# Patient Record
Sex: Male | Born: 1945
Health system: Southern US, Community
[De-identification: ages and names within clinical notes are randomized; demographics above are authoritative.]

## PROBLEM LIST (undated history)

## (undated) DIAGNOSIS — N401 Enlarged prostate with lower urinary tract symptoms: Secondary | ICD-10-CM

## (undated) DIAGNOSIS — I10 Essential (primary) hypertension: Secondary | ICD-10-CM

## (undated) DIAGNOSIS — M1990 Unspecified osteoarthritis, unspecified site: Secondary | ICD-10-CM

## (undated) DIAGNOSIS — F32A Depression, unspecified: Secondary | ICD-10-CM

## (undated) DIAGNOSIS — N138 Other obstructive and reflux uropathy: Secondary | ICD-10-CM

## (undated) DIAGNOSIS — Z5189 Encounter for other specified aftercare: Secondary | ICD-10-CM

## (undated) DIAGNOSIS — F4310 Post-traumatic stress disorder, unspecified: Secondary | ICD-10-CM

## (undated) DIAGNOSIS — F329 Major depressive disorder, single episode, unspecified: Secondary | ICD-10-CM

## (undated) DIAGNOSIS — N529 Male erectile dysfunction, unspecified: Secondary | ICD-10-CM

## (undated) DIAGNOSIS — G709 Myoneural disorder, unspecified: Secondary | ICD-10-CM

## (undated) DIAGNOSIS — F419 Anxiety disorder, unspecified: Secondary | ICD-10-CM

## (undated) DIAGNOSIS — E119 Type 2 diabetes mellitus without complications: Secondary | ICD-10-CM

## (undated) HISTORY — DX: Encounter for other specified aftercare: Z51.89

## (undated) HISTORY — DX: Myoneural disorder, unspecified: G70.9

## (undated) HISTORY — DX: Male erectile dysfunction, unspecified: N52.9

## (undated) HISTORY — DX: Benign prostatic hyperplasia with lower urinary tract symptoms: N13.8

## (undated) HISTORY — DX: Anxiety disorder, unspecified: F41.9

## (undated) HISTORY — DX: Unspecified osteoarthritis, unspecified site: M19.90

## (undated) HISTORY — DX: Essential (primary) hypertension: I10

## (undated) HISTORY — PX: KNEE SURGERY: SHX244

## (undated) HISTORY — DX: Type 2 diabetes mellitus without complications: E11.9

## (undated) HISTORY — DX: Post-traumatic stress disorder, unspecified: F43.10

## (undated) HISTORY — PX: LUMBAR FUSION: SHX111

## (undated) HISTORY — DX: Depression, unspecified: F32.A

## (undated) HISTORY — DX: Benign prostatic hyperplasia with lower urinary tract symptoms: N40.1

## (undated) HISTORY — DX: Major depressive disorder, single episode, unspecified: F32.9

---

## 1998-03-12 ENCOUNTER — Emergency Department (HOSPITAL_COMMUNITY): Admission: EM | Admit: 1998-03-12 | Discharge: 1998-03-12 | Payer: Self-pay | Admitting: *Deleted

## 1998-03-12 ENCOUNTER — Encounter: Payer: Self-pay | Admitting: Emergency Medicine

## 2003-06-22 ENCOUNTER — Encounter: Admission: RE | Admit: 2003-06-22 | Discharge: 2003-06-22 | Payer: Self-pay | Admitting: Family Medicine

## 2006-04-07 ENCOUNTER — Emergency Department (HOSPITAL_COMMUNITY): Admission: EM | Admit: 2006-04-07 | Discharge: 2006-04-07 | Payer: Self-pay | Admitting: Family Medicine

## 2007-10-02 ENCOUNTER — Ambulatory Visit: Payer: Self-pay | Admitting: Gastroenterology

## 2007-10-10 ENCOUNTER — Ambulatory Visit: Payer: Self-pay | Admitting: Gastroenterology

## 2007-10-10 HISTORY — PX: COLONOSCOPY: SHX174

## 2007-10-10 LAB — HM COLONOSCOPY

## 2008-08-25 ENCOUNTER — Ambulatory Visit: Payer: Self-pay | Admitting: Family Medicine

## 2008-08-25 DIAGNOSIS — I10 Essential (primary) hypertension: Secondary | ICD-10-CM

## 2008-08-25 DIAGNOSIS — F4310 Post-traumatic stress disorder, unspecified: Secondary | ICD-10-CM

## 2008-08-25 DIAGNOSIS — M1990 Unspecified osteoarthritis, unspecified site: Secondary | ICD-10-CM | POA: Insufficient documentation

## 2008-10-13 ENCOUNTER — Ambulatory Visit: Payer: Self-pay | Admitting: Family Medicine

## 2008-10-13 LAB — CONVERTED CEMR LAB
Bilirubin Urine: NEGATIVE
Blood in Urine, dipstick: NEGATIVE
Glucose, Urine, Semiquant: NEGATIVE
Ketones, urine, test strip: NEGATIVE
Nitrite: NEGATIVE
Protein, U semiquant: NEGATIVE
Specific Gravity, Urine: 1.015
Urobilinogen, UA: 0.2
WBC Urine, dipstick: NEGATIVE
pH: 6.5

## 2008-10-14 ENCOUNTER — Encounter: Payer: Self-pay | Admitting: Family Medicine

## 2008-10-14 LAB — CONVERTED CEMR LAB
ALT: 23 units/L (ref 0–53)
AST: 30 units/L (ref 0–37)
BUN: 14 mg/dL (ref 6–23)
Basophils Absolute: 0 10*3/uL (ref 0.0–0.1)
Bilirubin, Direct: 0 mg/dL (ref 0.0–0.3)
Cholesterol: 185 mg/dL (ref 0–200)
Creatinine, Ser: 1.2 mg/dL (ref 0.4–1.5)
Eosinophils Relative: 0.7 % (ref 0.0–5.0)
GFR calc non Af Amer: 78.88 mL/min (ref >60)
Glucose, Bld: 97 mg/dL (ref 70–99)
HDL: 54 mg/dL (ref >39.00)
LDL Cholesterol: 111 mg/dL — ABNORMAL HIGH (ref 0–99)
Monocytes Absolute: 0.3 10*3/uL (ref 0.1–1.0)
Monocytes Relative: 5.8 % (ref 3.0–12.0)
Neutrophils Relative %: 65.8 % (ref 43.0–77.0)
PSA: 1.17 ng/mL (ref 0.10–4.00)
Platelets: 156 10*3/uL (ref 150.0–400.0)
RDW: 13.5 % (ref 11.5–14.6)
Total Bilirubin: 1 mg/dL (ref 0.3–1.2)
Triglycerides: 100 mg/dL (ref 0.0–149.0)
VLDL: 20 mg/dL (ref 0.0–40.0)
WBC: 5.6 10*3/uL (ref 4.5–10.5)

## 2008-10-20 ENCOUNTER — Ambulatory Visit: Payer: Self-pay | Admitting: Family Medicine

## 2009-01-03 ENCOUNTER — Ambulatory Visit: Payer: Self-pay | Admitting: Family Medicine

## 2009-01-03 DIAGNOSIS — N138 Other obstructive and reflux uropathy: Secondary | ICD-10-CM

## 2009-01-03 DIAGNOSIS — M545 Low back pain: Secondary | ICD-10-CM

## 2009-01-03 DIAGNOSIS — N401 Enlarged prostate with lower urinary tract symptoms: Secondary | ICD-10-CM

## 2009-01-03 LAB — CONVERTED CEMR LAB
Bilirubin Urine: NEGATIVE
Blood in Urine, dipstick: NEGATIVE
Ketones, urine, test strip: NEGATIVE
Nitrite: NEGATIVE
Specific Gravity, Urine: 1.025

## 2009-01-31 ENCOUNTER — Ambulatory Visit: Payer: Self-pay | Admitting: Licensed Clinical Social Worker

## 2009-02-14 ENCOUNTER — Ambulatory Visit: Payer: Self-pay | Admitting: Licensed Clinical Social Worker

## 2009-02-23 ENCOUNTER — Ambulatory Visit: Payer: Self-pay | Admitting: Family Medicine

## 2009-02-23 DIAGNOSIS — N529 Male erectile dysfunction, unspecified: Secondary | ICD-10-CM | POA: Insufficient documentation

## 2009-02-24 ENCOUNTER — Encounter: Payer: Self-pay | Admitting: Family Medicine

## 2009-02-25 LAB — CONVERTED CEMR LAB: Creatinine, Ser: 1.23 mg/dL (ref 0.40–1.50)

## 2009-02-26 ENCOUNTER — Encounter: Admission: RE | Admit: 2009-02-26 | Discharge: 2009-02-26 | Payer: Self-pay | Admitting: Family Medicine

## 2009-02-28 ENCOUNTER — Ambulatory Visit: Payer: Self-pay | Admitting: Licensed Clinical Social Worker

## 2009-02-28 ENCOUNTER — Telehealth: Payer: Self-pay | Admitting: Family Medicine

## 2009-03-09 ENCOUNTER — Ambulatory Visit: Payer: Self-pay | Admitting: Licensed Clinical Social Worker

## 2009-03-21 ENCOUNTER — Ambulatory Visit: Payer: Self-pay | Admitting: Licensed Clinical Social Worker

## 2009-03-29 ENCOUNTER — Encounter (INDEPENDENT_AMBULATORY_CARE_PROVIDER_SITE_OTHER): Payer: Self-pay | Admitting: *Deleted

## 2009-03-30 ENCOUNTER — Ambulatory Visit: Payer: Self-pay | Admitting: Licensed Clinical Social Worker

## 2009-04-01 ENCOUNTER — Encounter (INDEPENDENT_AMBULATORY_CARE_PROVIDER_SITE_OTHER): Payer: Self-pay | Admitting: *Deleted

## 2009-04-06 ENCOUNTER — Ambulatory Visit: Payer: Self-pay | Admitting: Licensed Clinical Social Worker

## 2009-04-11 ENCOUNTER — Ambulatory Visit: Payer: Self-pay | Admitting: Family Medicine

## 2009-04-13 ENCOUNTER — Ambulatory Visit: Payer: Self-pay | Admitting: Licensed Clinical Social Worker

## 2009-04-20 ENCOUNTER — Encounter: Admission: RE | Admit: 2009-04-20 | Discharge: 2009-05-11 | Payer: Self-pay | Admitting: Diagnostic Neuroimaging

## 2009-04-27 ENCOUNTER — Ambulatory Visit: Payer: Self-pay | Admitting: Licensed Clinical Social Worker

## 2009-05-11 ENCOUNTER — Ambulatory Visit: Payer: Self-pay | Admitting: Licensed Clinical Social Worker

## 2009-05-16 ENCOUNTER — Encounter: Admission: RE | Admit: 2009-05-16 | Discharge: 2009-06-18 | Payer: Self-pay | Admitting: Diagnostic Neuroimaging

## 2009-05-18 ENCOUNTER — Ambulatory Visit: Payer: Self-pay | Admitting: Licensed Clinical Social Worker

## 2009-05-26 ENCOUNTER — Ambulatory Visit: Payer: Self-pay | Admitting: Licensed Clinical Social Worker

## 2009-06-01 ENCOUNTER — Ambulatory Visit: Payer: Self-pay | Admitting: Licensed Clinical Social Worker

## 2009-06-08 ENCOUNTER — Ambulatory Visit: Payer: Self-pay | Admitting: Licensed Clinical Social Worker

## 2009-06-09 ENCOUNTER — Encounter: Payer: Self-pay | Admitting: Family Medicine

## 2009-06-22 ENCOUNTER — Ambulatory Visit: Payer: Self-pay | Admitting: Licensed Clinical Social Worker

## 2009-07-20 ENCOUNTER — Ambulatory Visit: Payer: Self-pay | Admitting: Licensed Clinical Social Worker

## 2009-07-27 ENCOUNTER — Ambulatory Visit: Payer: Self-pay | Admitting: Licensed Clinical Social Worker

## 2009-08-15 ENCOUNTER — Ambulatory Visit: Payer: Self-pay | Admitting: Licensed Clinical Social Worker

## 2009-08-22 ENCOUNTER — Ambulatory Visit: Payer: Self-pay | Admitting: Licensed Clinical Social Worker

## 2009-08-30 ENCOUNTER — Ambulatory Visit: Payer: Self-pay | Admitting: Licensed Clinical Social Worker

## 2009-09-07 ENCOUNTER — Ambulatory Visit: Payer: Self-pay | Admitting: Licensed Clinical Social Worker

## 2009-09-14 ENCOUNTER — Ambulatory Visit: Payer: Self-pay | Admitting: Licensed Clinical Social Worker

## 2009-09-21 ENCOUNTER — Ambulatory Visit: Payer: Self-pay | Admitting: Licensed Clinical Social Worker

## 2009-09-30 ENCOUNTER — Telehealth: Payer: Self-pay | Admitting: Family Medicine

## 2009-10-03 ENCOUNTER — Ambulatory Visit: Payer: Self-pay | Admitting: Family Medicine

## 2009-10-03 DIAGNOSIS — M25569 Pain in unspecified knee: Secondary | ICD-10-CM | POA: Insufficient documentation

## 2009-10-05 ENCOUNTER — Ambulatory Visit: Payer: Self-pay | Admitting: Licensed Clinical Social Worker

## 2009-10-19 ENCOUNTER — Ambulatory Visit: Payer: Self-pay | Admitting: Licensed Clinical Social Worker

## 2009-10-26 ENCOUNTER — Ambulatory Visit: Payer: Self-pay | Admitting: Licensed Clinical Social Worker

## 2009-11-02 ENCOUNTER — Ambulatory Visit: Payer: Self-pay | Admitting: Licensed Clinical Social Worker

## 2009-11-02 ENCOUNTER — Telehealth: Payer: Self-pay | Admitting: Family Medicine

## 2009-11-09 ENCOUNTER — Ambulatory Visit: Payer: Self-pay | Admitting: Licensed Clinical Social Worker

## 2009-11-16 ENCOUNTER — Ambulatory Visit: Payer: Self-pay | Admitting: Licensed Clinical Social Worker

## 2009-11-23 ENCOUNTER — Ambulatory Visit: Payer: Self-pay | Admitting: Licensed Clinical Social Worker

## 2009-11-30 ENCOUNTER — Ambulatory Visit: Payer: Self-pay | Admitting: Family Medicine

## 2009-12-01 ENCOUNTER — Ambulatory Visit: Payer: Self-pay | Admitting: Licensed Clinical Social Worker

## 2009-12-07 ENCOUNTER — Ambulatory Visit: Payer: Self-pay | Admitting: Licensed Clinical Social Worker

## 2009-12-21 ENCOUNTER — Telehealth: Payer: Self-pay | Admitting: Family Medicine

## 2009-12-21 ENCOUNTER — Ambulatory Visit: Payer: Self-pay | Admitting: Licensed Clinical Social Worker

## 2010-01-04 ENCOUNTER — Ambulatory Visit: Payer: Self-pay | Admitting: Licensed Clinical Social Worker

## 2010-01-11 ENCOUNTER — Ambulatory Visit: Payer: Self-pay | Admitting: Licensed Clinical Social Worker

## 2010-01-19 ENCOUNTER — Ambulatory Visit: Payer: Self-pay | Admitting: Licensed Clinical Social Worker

## 2010-01-23 ENCOUNTER — Ambulatory Visit: Payer: Self-pay | Admitting: Family Medicine

## 2010-01-23 DIAGNOSIS — R42 Dizziness and giddiness: Secondary | ICD-10-CM

## 2010-01-25 ENCOUNTER — Ambulatory Visit: Payer: Self-pay | Admitting: Licensed Clinical Social Worker

## 2010-02-08 ENCOUNTER — Ambulatory Visit: Payer: Self-pay | Admitting: Licensed Clinical Social Worker

## 2010-02-09 ENCOUNTER — Telehealth: Payer: Self-pay | Admitting: Family Medicine

## 2010-02-15 ENCOUNTER — Ambulatory Visit: Payer: Self-pay | Admitting: Licensed Clinical Social Worker

## 2010-03-01 ENCOUNTER — Ambulatory Visit: Payer: Self-pay | Admitting: Licensed Clinical Social Worker

## 2010-03-15 ENCOUNTER — Ambulatory Visit: Payer: Self-pay | Admitting: Licensed Clinical Social Worker

## 2010-03-29 ENCOUNTER — Ambulatory Visit: Payer: Self-pay | Admitting: Licensed Clinical Social Worker

## 2010-04-05 ENCOUNTER — Ambulatory Visit: Payer: Self-pay | Admitting: Licensed Clinical Social Worker

## 2010-04-19 ENCOUNTER — Ambulatory Visit: Payer: Self-pay | Admitting: Licensed Clinical Social Worker

## 2010-04-26 ENCOUNTER — Ambulatory Visit: Payer: Self-pay | Admitting: Licensed Clinical Social Worker

## 2010-04-29 ENCOUNTER — Emergency Department (HOSPITAL_COMMUNITY)
Admission: EM | Admit: 2010-04-29 | Discharge: 2010-04-29 | Payer: Self-pay | Source: Home / Self Care | Admitting: Emergency Medicine

## 2010-05-01 ENCOUNTER — Ambulatory Visit: Payer: Self-pay | Admitting: Family Medicine

## 2010-05-02 ENCOUNTER — Encounter: Payer: Self-pay | Admitting: Family Medicine

## 2010-05-03 ENCOUNTER — Ambulatory Visit
Admission: RE | Admit: 2010-05-03 | Discharge: 2010-05-03 | Payer: Self-pay | Source: Home / Self Care | Attending: Licensed Clinical Social Worker | Admitting: Licensed Clinical Social Worker

## 2010-05-03 ENCOUNTER — Encounter: Payer: Self-pay | Admitting: Family Medicine

## 2010-05-17 ENCOUNTER — Ambulatory Visit
Admission: RE | Admit: 2010-05-17 | Discharge: 2010-05-17 | Payer: Self-pay | Source: Home / Self Care | Attending: Licensed Clinical Social Worker | Admitting: Licensed Clinical Social Worker

## 2010-05-24 ENCOUNTER — Ambulatory Visit
Admission: RE | Admit: 2010-05-24 | Discharge: 2010-05-24 | Payer: Self-pay | Source: Home / Self Care | Attending: Licensed Clinical Social Worker | Admitting: Licensed Clinical Social Worker

## 2010-06-13 NOTE — Progress Notes (Signed)
Summary: ? new med   Phone Note Call from Patient   Caller: pt walk-in Summary of Call: stated the "new med that was given to him by Dr Clent Ridges he cannot tell any difference. pls call  Initial call taken by: Pura Spice, RN,  December 21, 2009 1:02 PM  Follow-up for Phone Call        stay on Wellbutrin, but add Zoloft 50 mg a day to it. call in Zoloft 50 mg #30 with 2 rf.  Follow-up by: Nelwyn Salisbury MD,  December 21, 2009 1:18 PM  Additional Follow-up for Phone Call Additional follow up Details #1::        lmtcb Additional Follow-up by: Raechel Ache, RN,  December 21, 2009 1:20 PM    New/Updated Medications: ZOLOFT 50 MG TABS (SERTRALINE HCL) 1 once daily Prescriptions: ZOLOFT 50 MG TABS (SERTRALINE HCL) 1 once daily  #30 x 2   Entered by:   Raechel Ache, RN   Authorized by:   Nelwyn Salisbury MD   Signed by:   Raechel Ache, RN on 12/21/2009   Method used:   [facility]  [address] [phone]* (retail)       [address]       [place], [place]  [address]       Ph: [phone]       Fax: [phone]   RxID:   [id number]

## 2010-06-13 NOTE — Letter (Signed)
Summary: [facility]  [facility]   Imported By: Maryln Gottron 06/30/2009 14:59:08  _____________________________________________________________________  External Attachment:    Type:   Image     Comment:   External Document

## 2010-06-13 NOTE — Assessment & Plan Note (Signed)
Summary: fu on med/njr   Vital Signs:  Patient profile:   65 year old male Weight:      187 pounds BMI:     31.23 BP sitting:   116 / 84  (left arm) Cuff size:   regular  Vitals Entered By: Raechel Ache, RN (November 30, 2009 2:13 PM) CC: F/u meds.   History of Present Illness: Here to follow up on depression. he has felt some mild improvement on Wellbutrin, saying he sleeps better and his moods are slightly improved. He still has a way to go, however, and would like to increase the dose. He continues to see Judithe Modest for therapy as well.   Allergies: No Known Drug Allergies  Past History:  Past Medical History: Reviewed history from 04/11/2009 and no changes required. Chickenpox Depression Hypertension Osteoarthritis PTSD, sees a counselor through the [place] system Low back pain, sees Dr. Joycelyn Schmid normal NCS and EMG of legs 04-05-09  Review of Systems  The patient denies anorexia, fever, weight loss, weight gain, vision loss, decreased hearing, hoarseness, chest pain, syncope, dyspnea on exertion, peripheral edema, prolonged cough, headaches, hemoptysis, abdominal pain, melena, hematochezia, severe indigestion/heartburn, hematuria, incontinence, genital sores, muscle weakness, suspicious skin lesions, transient blindness, difficulty walking, unusual weight change, abnormal bleeding, enlarged lymph nodes, angioedema, breast masses, and testicular masses.    Physical Exam  General:  Well-developed,well-nourished,in no acute distress; alert,appropriate and cooperative throughout examination Psych:  Oriented X3, memory intact for recent and remote, normally interactive, good eye contact, and depressed affect.     Impression & Recommendations:  Problem # 1:  DEPRESSION (ICD-311)  The following medications were removed from the medication list:    Wellbutrin Xl 150 Mg Xr24h-tab (Bupropion hcl) .Marland Kitchen... 1 once daily His updated medication list for this problem includes:  Wellbutrin Xl 300 Mg Xr24h-tab (Bupropion hcl) ..... Once daily  Problem # 2:  POSTTRAUMATIC STRESS DISORDER (ICD-309.81)  Complete Medication List: 1)  Hydrochlorothiazide 25 Mg Tabs (Hydrochlorothiazide) .... Once daily 2)  Norvasc 5 Mg Tabs (Amlodipine besylate) .... Once daily 3)  Flexeril 10 Mg Tabs (Cyclobenzaprine hcl) .... Three times a day as needed spasm 4)  Vicodin Hp 10-660 Mg Tabs (Hydrocodone-acetaminophen) .Marland Kitchen.. 1 q 6 hours as needed pain 5)  Flomax 0.4 Mg Caps (Tamsulosin hcl) .... Once daily 6)  Cialis 20 Mg Tabs (Tadalafil) .... As needed 7)  Gabapentin 300 Mg Caps (Gabapentin) .... Two times a day 8)  Wellbutrin Xl 300 Mg Xr24h-tab (Bupropion hcl) .... Once daily  Patient Instructions: 1)  After discussing this for 30 minutes, we decided to increase the Wellbutrin to 300 mg a day.  2)  Please schedule a follow-up appointment in 2 weeks.  Prescriptions: WELLBUTRIN XL 300 MG XR24H-TAB (BUPROPION HCL) once daily  #30 x 11   Entered and Authorized by:   Nelwyn Salisbury MD   Signed by:   Nelwyn Salisbury MD on 11/30/2009   Method used:   [facility]        [facility]  [address] [phone]* (retail)       [address]       [place], [place]  [address]       Ph: [phone]       Fax: [phone]   RxID:   [id number]

## 2010-06-13 NOTE — Progress Notes (Signed)
Summary: NEW MED NOT WORKING  Phone Note Call from Patient Call back at Home Phone [phone]   Caller: Patient Call For: Nelwyn Salisbury MD Summary of Call: PT STATED NEW MED NOT WORKING(prozac) Initial call taken by: Heron Sabins,  February 09, 2010 2:21 PM  Follow-up for Phone Call        increase Prozac to 40 mg two times a day for 2 weeks, then give me a report Follow-up by: Nelwyn Salisbury MD,  February 10, 2010 10:23 AM  Additional Follow-up for Phone Call Additional follow up Details #1::        Notified pt. Additional Follow-up by: Lynann Beaver CMA,  February 10, 2010 11:34 AM    New/Updated Medications: PROZAC 40 MG CAPS (FLUOXETINE HCL) one by mouth bid

## 2010-06-13 NOTE — Progress Notes (Signed)
Summary: refill Vicodin  Phone Note Refill Request Message from:  Fax from Pharmacy on Sep 30, 2009 11:17 AM  Refills Requested: Medication #1:  VICODIN HP 10-660 MG TABS 1 q 6 hours as needed pain   Dosage confirmed as above?Dosage Confirmed   Supply Requested: 1 month   Last Refilled: 08/27/2009  Method Requested: Fax to Local Pharmacy Initial call taken by: Raechel Ache, RN,  Sep 30, 2009 11:17 AM [facility]er: [facility]  [address] [phone]*  Follow-up for Phone Call        I referred him to Dr. Murray Hodgkins to treat his back pain, and he did see him in January. He needs to get all hus pain meds from Dr. Murray Hodgkins now Follow-up by: Nelwyn Salisbury MD,  Sep 30, 2009 2:51 PM  Additional Follow-up for Phone Call Additional follow up Details #1::        Rx denial called/faxed to pharmacy Additional Follow-up by: Raechel Ache, RN,  Sep 30, 2009 3:09 PM

## 2010-06-13 NOTE — Assessment & Plan Note (Signed)
Summary: fu on med/njr   Vital Signs:  Patient profile:   65 year old male Weight:      192 pounds O2 Sat:      97 % Temp:     98.8 degrees F Pulse rate:   98 / minute BP sitting:   140 / 80  (left arm) Cuff size:   large  Vitals Entered By: Pura Spice, RN (January 23, 2010 10:04 AM) CC: discuss meds refills  Is Patient Diabetic? No   History of Present Illness: Here to follow up on depression. he has been taking a combination of Wellbutrin and Zoloft, abut he does not feel amy better. In fact, he has had a few dizzy spells which occur when he stands up quickly and which go away after a few seconds. He says these started after he began taking Zoloft.   Allergies (verified): No Known Drug Allergies  Past History:  Past Medical History: Reviewed history from 04/11/2009 and no changes required. Chickenpox Depression Hypertension Osteoarthritis PTSD, sees a counselor through the [place] system Low back pain, sees Dr. Joycelyn Schmid normal NCS and EMG of legs 04-05-09  Review of Systems  The patient denies anorexia, fever, weight loss, weight gain, vision loss, decreased hearing, hoarseness, chest pain, syncope, dyspnea on exertion, peripheral edema, prolonged cough, headaches, hemoptysis, abdominal pain, melena, hematochezia, severe indigestion/heartburn, hematuria, incontinence, genital sores, muscle weakness, suspicious skin lesions, transient blindness, difficulty walking, depression, unusual weight change, abnormal bleeding, enlarged lymph nodes, angioedema, breast masses, and testicular masses.    Physical Exam  General:  Well-developed,well-nourished,in no acute distress; alert,appropriate and cooperative throughout examination Neck:  No deformities, masses, or tenderness noted. Lungs:  Normal respiratory effort, chest expands symmetrically. Lungs are clear to auscultation, no crackles or wheezes. Heart:  Normal rate and regular rhythm. S1 and S2 normal without  gallop, murmur, click, rub or other extra sounds. Psych:  Oriented X3, memory intact for recent and remote, normally interactive, good eye contact, and subdued.     Impression & Recommendations:  Problem # 1:  DEPRESSION (ICD-311)  The following medications were removed from the medication list:    Zoloft 50 Mg Tabs (Sertraline hcl) .Marland Kitchen... 1 once daily His updated medication list for this problem includes:    Wellbutrin Xl 300 Mg Xr24h-tab (Bupropion hcl) ..... Once daily    Prozac 40 Mg Caps (Fluoxetine hcl) ..... Once daily  Problem # 2:  DIZZINESS (ICD-780.4)  Complete Medication List: 1)  Hydrochlorothiazide 25 Mg Tabs (Hydrochlorothiazide) .... Once daily 2)  Norvasc 5 Mg Tabs (Amlodipine besylate) .... Once daily 3)  Flexeril 10 Mg Tabs (Cyclobenzaprine hcl) .... Three times a day as needed spasm 4)  Vicodin Hp 10-660 Mg Tabs (Hydrocodone-acetaminophen) .Marland Kitchen.. 1 q 6 hours as needed pain 5)  Flomax 0.4 Mg Caps (Tamsulosin hcl) .... Once daily 6)  Cialis 20 Mg Tabs (Tadalafil) .... As needed 7)  Gabapentin 300 Mg Caps (Gabapentin) .... Two times a day 8)  Wellbutrin Xl 300 Mg Xr24h-tab (Bupropion hcl) .... Once daily 9)  Prozac 40 Mg Caps (Fluoxetine hcl) .... Once daily  Patient Instructions: 1)  Stop Zoloft and try Prozac.  2)  Please schedule a follow-up appointment in 2 weeks.  Prescriptions: VICODIN HP 10-660 MG TABS (HYDROCODONE-ACETAMINOPHEN) 1 q 6 hours as needed pain  #60 x 5   Entered and Authorized by:   Nelwyn Salisbury MD   Signed by:   Nelwyn Salisbury MD on 01/23/2010   Method used:  Print then Give to Patient   RxID:   [id number] FLOMAX 0.4 MG CAPS (TAMSULOSIN HCL) once daily  #30 x 11   Entered and Authorized by:   Nelwyn Salisbury MD   Signed by:   Nelwyn Salisbury MD on 01/23/2010   Method used:   [facility]ron[facility] to        [facility]  [address] [phone]* (retail)       [address]       [place], [place]  [address]       Ph: [phone]        Fax: [phone]   RxID:   [id number] PROZAC 40 MG CAPS (FLUOXETINE HCL) once daily  #30 x 5   Entered and Authorized by:   Nelwyn Salisbury MD   Signed by:   Nelwyn Salisbury MD on 01/23/2010   Method used:   [facility]ronically to        [facility]  [address] [phone]* (retail)       [address]       [place], [place]  [address]       Ph: [phone]       Fax: [phone]   RxID:   [id number]

## 2010-06-13 NOTE — Assessment & Plan Note (Signed)
Summary: EVAL R KNEE PAIN / MED CK / REFILLS // RS   Vital Signs:  Patient profile:   65 year old male Weight:      183 pounds BMI:     30.56 BP sitting:   120 / 88  (left arm) Cuff size:   regular  Vitals Entered By: Raechel Ache, RN (Oct 03, 2009 1:21 PM) CC: C/o R knee pain and swelling.   History of Present Illness: Here for med refills and to look at his right knee. he has known arthritis in his knees, but over the weekend he did a lot of yardwork, and he developed worse pain in the right knee. It became swollen and painful. He is using ice and ACE wraps on it. He has not seen Dr. Murray Hodgkins in months, and he does not plan to see him again.   Allergies: No Known Drug Allergies  Past History:  Past Medical History: Reviewed history from 04/11/2009 and no changes required. Chickenpox Depression Hypertension Osteoarthritis PTSD, sees a counselor through the [place] [facility] Low back pain, sees Dr. Joycelyn Schmid normal NCS and EMG of legs 04-05-09  Past Surgical History: Reviewed history from 02/23/2009 and no changes required. lumbar fusion  1977 at [facility] colonoscopy 10-10-07 per Dr. Arlyce Dice, repeat in 10 yrs  Review of Systems  The patient denies anorexia, fever, weight loss, weight gain, vision loss, decreased hearing, hoarseness, chest pain, syncope, dyspnea on exertion, peripheral edema, prolonged cough, headaches, hemoptysis, abdominal pain, melena, hematochezia, severe indigestion/heartburn, hematuria, incontinence, genital sores, muscle weakness, suspicious skin lesions, transient blindness, difficulty walking, depression, unusual weight change, abnormal bleeding, enlarged lymph nodes, angioedema, breast masses, and testicular masses.    Physical Exam  General:  limping slightly Msk:  right knee is swollen and  tender at the medial joint space. Not warm or red. Full ROM. Lots of crepitus. Negarive McMurrays and anterior drawer.    Impression &  Recommendations:  Problem # 1:  KNEE PAIN (ICD-719.46)  His updated medication list for this problem includes:    Flexeril 10 Mg Tabs (Cyclobenzaprine hcl) .Marland Kitchen... Three times a day as needed spasm    Vicodin Hp 10-660 Mg Tabs (Hydrocodone-acetaminophen) .Marland Kitchen... 1 q 6 hours as needed pain  Problem # 2:  OSTEOARTHRITIS (ICD-715.90)  His updated medication list for this problem includes:    Vicodin Hp 10-660 Mg Tabs (Hydrocodone-acetaminophen) .Marland Kitchen... 1 q 6 hours as needed pain  Complete Medication List: 1)  Hydrochlorothiazide 25 Mg Tabs (Hydrochlorothiazide) .... Once daily 2)  Norvasc 5 Mg Tabs (Amlodipine besylate) .... Once daily 3)  Flexeril 10 Mg Tabs (Cyclobenzaprine hcl) .... Three times a day as needed spasm 4)  Vicodin Hp 10-660 Mg Tabs (Hydrocodone-acetaminophen) .Marland Kitchen.. 1 q 6 hours as needed pain 5)  Flomax 0.4 Mg Caps (Tamsulosin hcl) .... Once daily 6)  Cialis 20 Mg Tabs (Tadalafil) .... As needed 7)  Gabapentin 300 Mg Caps (Gabapentin) .... Two times a day 8)  Prednisone (pak) 10 Mg Tabs (Prednisone) .... As directed for 12 days  Patient Instructions: 1)  rest, ice, support wraps.  2)  Please schedule a follow-up appointment as needed .  Prescriptions: PREDNISONE (PAK) 10 MG TABS (PREDNISONE) as directed for 12 days  #1 x 0   Entered and Authorized by:   Nelwyn Salisbury MD   Signed by:   Nelwyn Salisbury MD on 10/03/2009   Method used:   Print then Give to Patient   RxID:  [id number] VICODIN HP 10-660 MG TABS (HYDROCODONE-ACETAMINOPHEN) 1 q 6 hours as needed pain  #60 x 2   Entered and Authorized by:   Nelwyn Salisbury MD   Signed by:   Nelwyn Salisbury MD on 10/03/2009   Method used:   Print then Give to Patient   RxID:   [id number]

## 2010-06-13 NOTE — Progress Notes (Signed)
  Phone Note Call from Patient Call back at [facility]   Summary of Call: After meeting with Judithe Modest for his depression, he has finally agreed to try a medication. He asks [place] to call something in.  Initial call taken by: Nelwyn Salisbury MD,  November 02, 2009 3:07 PM  Follow-up for Phone Call        call in Wellbutrin XL 150 mg once daily , #30 with 2 rf to [facility]. Have him see me in 3 weeks Follow-up by: Nelwyn Salisbury MD,  November 02, 2009 3:13 PM  Additional Follow-up for Phone Call Additional follow up Details #1::        Rx Called In Additional Follow-up by: Raechel Ache, RN,  November 02, 2009 3:42 PM    New/Updated Medications: WELLBUTRIN XL 150 MG XR24H-TAB (BUPROPION HCL) 1 once daily Prescriptions: WELLBUTRIN XL 150 MG XR24H-TAB (BUPROPION HCL) 1 once daily  #30 x 2   Entered by:   Raechel Ache, RN   Authorized by:   Nelwyn Salisbury MD   Signed by:   Raechel Ache, RN on 11/02/2009   Method used:   Electronically to        [facility]  [address] [phone]* (retail)       [address]       [place], [place]  [address]       Ph: [phone]       Fax: [phone]   RxID:   [id number]

## 2010-06-14 ENCOUNTER — Ambulatory Visit (INDEPENDENT_AMBULATORY_CARE_PROVIDER_SITE_OTHER): Payer: Federal, State, Local not specified - PPO | Admitting: Licensed Clinical Social Worker

## 2010-06-14 DIAGNOSIS — F4310 Post-traumatic stress disorder, unspecified: Secondary | ICD-10-CM

## 2010-06-14 DIAGNOSIS — F331 Major depressive disorder, recurrent, moderate: Secondary | ICD-10-CM

## 2010-06-14 DIAGNOSIS — F411 Generalized anxiety disorder: Secondary | ICD-10-CM

## 2010-06-15 NOTE — Letter (Signed)
Summary: [facility]   Imported By: Maryln Gottron 05/17/2010 13:13:11  _____________________________________________________________________  External Attachment:    Type:   Image     Comment:   External Document

## 2010-06-15 NOTE — Letter (Signed)
Summary: [facility]  [facility]   Imported By: Maryln Gottron 05/17/2010 13:54:21  _____________________________________________________________________  External Attachment:    Type:   Image     Comment:   External Document

## 2010-06-15 NOTE — Assessment & Plan Note (Signed)
Summary: er fup---cystitis//ccm   Vital Signs:  Patient profile:   65 year old male Weight:      195 pounds O2 Sat:      92 % Temp:     98.8 degrees F Pulse rate:   82 / minute BP sitting:   120 / 82  (left arm)  Vitals Entered By: Pura Spice, RN (May 01, 2010 11:14 AM) CC: seeing urologist  Dr Jacquelyne Balint tomorrow 12-20  went to Er on Sat on cipro  500mg  for 21 days  has catheter now   History of Present Illness: Here to follow up on a UTI and urinary retention due to prostatosis. He was in the ER on 04-29-10 and had a foley catheter inserted. He was started on Cipro, and he is scheduled to see Dr. McDiarmid tomorrow. he feels better now, no fever or pain.   Allergies (verified): No Known Drug Allergies  Past History:  Past Medical History: Reviewed history from 04/11/2009 and no changes required. Chickenpox Depression Hypertension Osteoarthritis PTSD, sees a counselor through the [place] [facility] Low back pain, sees Dr. Joycelyn Schmid normal NCS and EMG of legs 04-05-09  Past Surgical History: Reviewed history from 02/23/2009 and no changes required. lumbar fusion  1977 at [facility] colonoscopy 10-10-07 per Dr. Arlyce Dice, repeat in 10 yrs  Review of Systems  The patient denies anorexia, fever, weight loss, weight gain, vision loss, decreased hearing, hoarseness, chest pain, syncope, dyspnea on exertion, peripheral edema, prolonged cough, headaches, hemoptysis, abdominal pain, melena, hematochezia, severe indigestion/heartburn, hematuria, incontinence, genital sores, muscle weakness, suspicious skin lesions, transient blindness, difficulty walking, depression, unusual weight change, abnormal bleeding, enlarged lymph nodes, angioedema, breast masses, and testicular masses.    Physical Exam  General:  Well-developed,well-nourished,in no acute distress; alert,appropriate and cooperative throughout examination Abdomen:  Bowel sounds positive,abdomen soft and  non-tender without masses, organomegaly or hernias noted. Genitalia:  foley is in place    Impression & Recommendations:  Problem # 1:  ACUTE PROSTATITIS (ICD-601.0)  Problem # 2:  BENIGN PROSTATIC HYPERTROPHY, WITH OBSTRUCTION (ICD-600.01)  Complete Medication List: 1)  Hydrochlorothiazide 25 Mg Tabs (Hydrochlorothiazide) .... Once daily 2)  Norvasc 5 Mg Tabs (Amlodipine besylate) .... Once daily 3)  Flexeril 10 Mg Tabs (Cyclobenzaprine hcl) .... Three times a day as needed spasm 4)  Vicodin Hp 10-660 Mg Tabs (Hydrocodone-acetaminophen) .Marland Kitchen.. 1 q 6 hours as needed pain 5)  Flomax 0.4 Mg Caps (Tamsulosin hcl) .... Once daily 6)  Cialis 20 Mg Tabs (Tadalafil) .... As needed 7)  Gabapentin 300 Mg Caps (Gabapentin) .... Two times a day 8)  Wellbutrin Xl 300 Mg Xr24h-tab (Bupropion hcl) .... Once daily 9)  Prozac 40 Mg Caps (Fluoxetine hcl) .... One by mouth bid 10)  Cipro 500 Mg Tabs (Ciprofloxacin hcl)  Patient Instructions: 1)  see Urology tomorrow    Orders Added: 1)  Est. Patient Level IV [16109]

## 2010-06-21 ENCOUNTER — Ambulatory Visit (INDEPENDENT_AMBULATORY_CARE_PROVIDER_SITE_OTHER): Payer: Federal, State, Local not specified - PPO | Admitting: Licensed Clinical Social Worker

## 2010-06-21 DIAGNOSIS — F411 Generalized anxiety disorder: Secondary | ICD-10-CM

## 2010-06-21 DIAGNOSIS — F331 Major depressive disorder, recurrent, moderate: Secondary | ICD-10-CM

## 2010-06-21 DIAGNOSIS — F4310 Post-traumatic stress disorder, unspecified: Secondary | ICD-10-CM

## 2010-07-05 ENCOUNTER — Ambulatory Visit (INDEPENDENT_AMBULATORY_CARE_PROVIDER_SITE_OTHER): Payer: Federal, State, Local not specified - PPO | Admitting: Licensed Clinical Social Worker

## 2010-07-05 DIAGNOSIS — F411 Generalized anxiety disorder: Secondary | ICD-10-CM

## 2010-07-05 DIAGNOSIS — F331 Major depressive disorder, recurrent, moderate: Secondary | ICD-10-CM

## 2010-07-05 DIAGNOSIS — F4310 Post-traumatic stress disorder, unspecified: Secondary | ICD-10-CM

## 2010-07-24 LAB — URINALYSIS, ROUTINE W REFLEX MICROSCOPIC
Glucose, UA: NEGATIVE mg/dL
Ketones, ur: NEGATIVE mg/dL
Nitrite: NEGATIVE
Protein, ur: NEGATIVE mg/dL
Urobilinogen, UA: 1 mg/dL (ref 0.0–1.0)

## 2010-07-24 LAB — URINE CULTURE
Colony Count: >=100000
Culture  Setup Time: 201112171056

## 2010-07-24 LAB — URINE MICROSCOPIC-ADD ON

## 2010-07-26 ENCOUNTER — Ambulatory Visit (INDEPENDENT_AMBULATORY_CARE_PROVIDER_SITE_OTHER): Payer: Federal, State, Local not specified - PPO | Admitting: Licensed Clinical Social Worker

## 2010-07-26 DIAGNOSIS — F411 Generalized anxiety disorder: Secondary | ICD-10-CM

## 2010-07-26 DIAGNOSIS — F331 Major depressive disorder, recurrent, moderate: Secondary | ICD-10-CM

## 2010-07-26 DIAGNOSIS — F4310 Post-traumatic stress disorder, unspecified: Secondary | ICD-10-CM

## 2010-08-09 ENCOUNTER — Ambulatory Visit (INDEPENDENT_AMBULATORY_CARE_PROVIDER_SITE_OTHER): Payer: Federal, State, Local not specified - PPO | Admitting: Licensed Clinical Social Worker

## 2010-08-09 DIAGNOSIS — F4310 Post-traumatic stress disorder, unspecified: Secondary | ICD-10-CM

## 2010-08-09 DIAGNOSIS — F331 Major depressive disorder, recurrent, moderate: Secondary | ICD-10-CM

## 2010-08-09 DIAGNOSIS — F411 Generalized anxiety disorder: Secondary | ICD-10-CM

## 2010-08-15 ENCOUNTER — Encounter: Payer: Self-pay | Admitting: Family Medicine

## 2010-08-15 ENCOUNTER — Ambulatory Visit (INDEPENDENT_AMBULATORY_CARE_PROVIDER_SITE_OTHER): Payer: Federal, State, Local not specified - PPO | Admitting: Licensed Clinical Social Worker

## 2010-08-15 DIAGNOSIS — F411 Generalized anxiety disorder: Secondary | ICD-10-CM

## 2010-08-15 DIAGNOSIS — F331 Major depressive disorder, recurrent, moderate: Secondary | ICD-10-CM

## 2010-08-15 DIAGNOSIS — F4310 Post-traumatic stress disorder, unspecified: Secondary | ICD-10-CM

## 2010-08-16 ENCOUNTER — Ambulatory Visit (INDEPENDENT_AMBULATORY_CARE_PROVIDER_SITE_OTHER): Payer: Federal, State, Local not specified - PPO | Admitting: Family Medicine

## 2010-08-16 ENCOUNTER — Other Ambulatory Visit: Payer: Self-pay | Admitting: Family Medicine

## 2010-08-16 ENCOUNTER — Encounter: Payer: Self-pay | Admitting: Family Medicine

## 2010-08-16 VITALS — BP 132/86 | HR 104 | Wt 195.0 lb

## 2010-08-16 DIAGNOSIS — L989 Disorder of the skin and subcutaneous tissue, unspecified: Secondary | ICD-10-CM

## 2010-08-16 DIAGNOSIS — M549 Dorsalgia, unspecified: Secondary | ICD-10-CM

## 2010-08-16 DIAGNOSIS — I10 Essential (primary) hypertension: Secondary | ICD-10-CM

## 2010-08-16 MED ORDER — HYDROCODONE-ACETAMINOPHEN 10-660 MG PO TABS: 1.0000 | ORAL_TABLET | Freq: Four times a day (QID) | ORAL | Status: DC | PRN

## 2010-08-16 MED ORDER — HYDROCHLOROTHIAZIDE 25 MG PO TABS: 25.0000 mg | ORAL_TABLET | Freq: Every day | ORAL | Status: DC

## 2010-08-16 MED ORDER — AMLODIPINE BESYLATE 5 MG PO TABS: 5.0000 mg | ORAL_TABLET | Freq: Every day | ORAL | Status: DC

## 2010-08-16 NOTE — Progress Notes (Signed)
  Subjective:    Patient ID: [name], male    DOB: [birth year], 65 y.o.   MRN: [id number]  HPI He also has a large mole on the right temple that he wants removed.    Review of Systems     Objective:   Physical Exam  Skin:       Large benign appearing nevus on the right temple, uniform dark brown color           Assessment & Plan:  We will refer to Dermatology for this

## 2010-08-16 NOTE — Progress Notes (Signed)
  Subjective:    Patient ID: [name], male    DOB: [birth year], 65 y.o.   MRN: [id number]  HPI Here for refills. He is doing well, and his BP is stable. He has had no prostate problems for awhile. He is seeing Dr. Raquel James at [facility] for his depression, and he has been taking Abilify for about one month now. His back and knee pain is stable.    Review of Systems  Constitutional: Negative.   Respiratory: Negative.   Cardiovascular: Negative.   Genitourinary: Negative.        Objective:   Physical Exam  Constitutional: He appears well-developed and well-nourished.  Neck: No thyromegaly present.  Cardiovascular: Normal rate, regular rhythm, normal heart sounds and intact distal pulses.   Pulmonary/Chest: Effort normal and breath sounds normal.  Musculoskeletal: Normal range of motion. He exhibits no edema and no tenderness.  Lymphadenopathy:    He has no cervical adenopathy.          Assessment & Plan:  meds were refilled.

## 2010-08-16 NOTE — Progress Notes (Signed)
Addended by: Dwaine Deter on: 08/16/2010 09:45 AM   Modules accepted: Orders

## 2010-08-24 ENCOUNTER — Ambulatory Visit (INDEPENDENT_AMBULATORY_CARE_PROVIDER_SITE_OTHER): Payer: Federal, State, Local not specified - PPO | Admitting: Licensed Clinical Social Worker

## 2010-08-24 DIAGNOSIS — F331 Major depressive disorder, recurrent, moderate: Secondary | ICD-10-CM

## 2010-08-24 DIAGNOSIS — F4310 Post-traumatic stress disorder, unspecified: Secondary | ICD-10-CM

## 2010-08-24 DIAGNOSIS — F411 Generalized anxiety disorder: Secondary | ICD-10-CM

## 2010-08-30 ENCOUNTER — Ambulatory Visit (INDEPENDENT_AMBULATORY_CARE_PROVIDER_SITE_OTHER): Payer: Federal, State, Local not specified - PPO | Admitting: Licensed Clinical Social Worker

## 2010-08-30 DIAGNOSIS — F331 Major depressive disorder, recurrent, moderate: Secondary | ICD-10-CM

## 2010-08-30 DIAGNOSIS — F411 Generalized anxiety disorder: Secondary | ICD-10-CM

## 2010-08-30 DIAGNOSIS — F4310 Post-traumatic stress disorder, unspecified: Secondary | ICD-10-CM

## 2010-09-03 ENCOUNTER — Other Ambulatory Visit: Payer: Self-pay | Admitting: Family Medicine

## 2010-09-04 ENCOUNTER — Telehealth: Payer: Self-pay | Admitting: Family Medicine

## 2010-09-04 NOTE — Telephone Encounter (Signed)
Pharmacy requesting refill on Gabapentin(300mg).

## 2010-09-05 NOTE — Telephone Encounter (Signed)
Call in Gabapentin 300 mg tid, #90 with 5 rf

## 2010-09-05 NOTE — Telephone Encounter (Signed)
Rx phoned in.   

## 2010-09-06 ENCOUNTER — Ambulatory Visit (INDEPENDENT_AMBULATORY_CARE_PROVIDER_SITE_OTHER): Payer: Federal, State, Local not specified - PPO | Admitting: Licensed Clinical Social Worker

## 2010-09-06 DIAGNOSIS — F331 Major depressive disorder, recurrent, moderate: Secondary | ICD-10-CM

## 2010-09-06 DIAGNOSIS — F4310 Post-traumatic stress disorder, unspecified: Secondary | ICD-10-CM

## 2010-09-06 DIAGNOSIS — F411 Generalized anxiety disorder: Secondary | ICD-10-CM

## 2010-09-19 ENCOUNTER — Ambulatory Visit (INDEPENDENT_AMBULATORY_CARE_PROVIDER_SITE_OTHER): Payer: Federal, State, Local not specified - PPO | Admitting: Licensed Clinical Social Worker

## 2010-09-19 DIAGNOSIS — F4310 Post-traumatic stress disorder, unspecified: Secondary | ICD-10-CM

## 2010-09-19 DIAGNOSIS — F411 Generalized anxiety disorder: Secondary | ICD-10-CM

## 2010-09-19 DIAGNOSIS — F331 Major depressive disorder, recurrent, moderate: Secondary | ICD-10-CM

## 2010-09-27 ENCOUNTER — Ambulatory Visit (INDEPENDENT_AMBULATORY_CARE_PROVIDER_SITE_OTHER): Payer: Federal, State, Local not specified - PPO | Admitting: Licensed Clinical Social Worker

## 2010-09-27 DIAGNOSIS — F4310 Post-traumatic stress disorder, unspecified: Secondary | ICD-10-CM

## 2010-09-27 DIAGNOSIS — F411 Generalized anxiety disorder: Secondary | ICD-10-CM

## 2010-09-27 DIAGNOSIS — F331 Major depressive disorder, recurrent, moderate: Secondary | ICD-10-CM

## 2010-10-12 ENCOUNTER — Ambulatory Visit (INDEPENDENT_AMBULATORY_CARE_PROVIDER_SITE_OTHER): Payer: Federal, State, Local not specified - PPO | Admitting: Licensed Clinical Social Worker

## 2010-10-12 DIAGNOSIS — F331 Major depressive disorder, recurrent, moderate: Secondary | ICD-10-CM

## 2010-10-12 DIAGNOSIS — F4310 Post-traumatic stress disorder, unspecified: Secondary | ICD-10-CM

## 2010-10-12 DIAGNOSIS — F411 Generalized anxiety disorder: Secondary | ICD-10-CM

## 2010-10-25 ENCOUNTER — Ambulatory Visit (INDEPENDENT_AMBULATORY_CARE_PROVIDER_SITE_OTHER): Payer: Federal, State, Local not specified - PPO | Admitting: Licensed Clinical Social Worker

## 2010-10-25 DIAGNOSIS — F331 Major depressive disorder, recurrent, moderate: Secondary | ICD-10-CM

## 2010-10-25 DIAGNOSIS — F411 Generalized anxiety disorder: Secondary | ICD-10-CM

## 2010-10-25 DIAGNOSIS — F4310 Post-traumatic stress disorder, unspecified: Secondary | ICD-10-CM

## 2010-11-01 ENCOUNTER — Ambulatory Visit (INDEPENDENT_AMBULATORY_CARE_PROVIDER_SITE_OTHER): Payer: Federal, State, Local not specified - PPO | Admitting: Licensed Clinical Social Worker

## 2010-11-01 DIAGNOSIS — F4310 Post-traumatic stress disorder, unspecified: Secondary | ICD-10-CM

## 2010-11-01 DIAGNOSIS — F411 Generalized anxiety disorder: Secondary | ICD-10-CM

## 2010-11-01 DIAGNOSIS — F331 Major depressive disorder, recurrent, moderate: Secondary | ICD-10-CM

## 2010-11-08 ENCOUNTER — Ambulatory Visit (INDEPENDENT_AMBULATORY_CARE_PROVIDER_SITE_OTHER): Payer: Federal, State, Local not specified - PPO | Admitting: Licensed Clinical Social Worker

## 2010-11-08 DIAGNOSIS — F331 Major depressive disorder, recurrent, moderate: Secondary | ICD-10-CM

## 2010-11-08 DIAGNOSIS — F4310 Post-traumatic stress disorder, unspecified: Secondary | ICD-10-CM

## 2010-11-08 DIAGNOSIS — F411 Generalized anxiety disorder: Secondary | ICD-10-CM

## 2010-12-04 ENCOUNTER — Ambulatory Visit (INDEPENDENT_AMBULATORY_CARE_PROVIDER_SITE_OTHER): Payer: Federal, State, Local not specified - PPO | Admitting: Family Medicine

## 2010-12-04 ENCOUNTER — Encounter: Payer: Self-pay | Admitting: Family Medicine

## 2010-12-04 VITALS — BP 144/88 | HR 97 | Temp 98.5°F | Wt 198.0 lb

## 2010-12-04 DIAGNOSIS — I10 Essential (primary) hypertension: Secondary | ICD-10-CM

## 2010-12-04 DIAGNOSIS — R6882 Decreased libido: Secondary | ICD-10-CM

## 2010-12-04 LAB — BASIC METABOLIC PANEL
BUN: 17 mg/dL (ref 6–23)
CO2: 28 mEq/L (ref 19–32)
Calcium: 9.4 mg/dL (ref 8.4–10.5)
Chloride: 95 mEq/L — ABNORMAL LOW (ref 96–112)
Creatinine, Ser: 1.3 mg/dL (ref 0.4–1.5)
Glucose, Bld: 133 mg/dL — ABNORMAL HIGH (ref 70–99)

## 2010-12-04 LAB — CBC WITH DIFFERENTIAL/PLATELET
Basophils Absolute: 0 10*3/uL (ref 0.0–0.1)
Eosinophils Relative: 0.6 % (ref 0.0–5.0)
HCT: 41.9 % (ref 39.0–52.0)
Lymphs Abs: 1.9 10*3/uL (ref 0.7–4.0)
Monocytes Absolute: 0.4 10*3/uL (ref 0.1–1.0)
Monocytes Relative: 8 % (ref 3.0–12.0)
Neutrophils Relative %: 54.3 % (ref 43.0–77.0)
Platelets: 164 10*3/uL (ref 150.0–400.0)
RDW: 14.4 % (ref 11.5–14.6)
WBC: 5.1 10*3/uL (ref 4.5–10.5)

## 2010-12-04 LAB — POCT URINALYSIS DIPSTICK
Glucose, UA: NEGATIVE
Nitrite, UA: NEGATIVE
Protein, UA: NEGATIVE
Spec Grav, UA: 1.025
Urobilinogen, UA: 0.2

## 2010-12-04 LAB — HEPATIC FUNCTION PANEL
ALT: 39 U/L (ref 0–53)
Albumin: 4.2 g/dL (ref 3.5–5.2)
Total Protein: 8.6 g/dL — ABNORMAL HIGH (ref 6.0–8.3)

## 2010-12-04 LAB — TESTOSTERONE: Testosterone: 306.27 ng/dL — ABNORMAL LOW (ref 350.00–890.00)

## 2010-12-04 LAB — TSH: TSH: 0.9 u[IU]/mL (ref 0.35–5.50)

## 2010-12-04 NOTE — Progress Notes (Signed)
  Subjective:    Patient ID: [name], male    DOB: Apr 02, 1946, 65 y.o.   MRN: [id number]  HPI Here asking to have his testosterone level checked. He has had trouble with low libido for several years, but it has been worse lately. Cialis helps a little with his erections. His depression and PTSD are stable.    Review of Systems  Constitutional: Negative.   Genitourinary: Negative.   Psychiatric/Behavioral: Negative.        Objective:   Physical Exam  Constitutional: He is oriented to person, place, and time. He appears well-developed and well-nourished.  Neurological: He is alert and oriented to person, place, and time. No cranial nerve deficit. He exhibits normal muscle tone. Coordination normal.  Psychiatric: He has a normal mood and affect. His behavior is normal. Thought content normal.          Assessment & Plan:  We will draw labs today to assess for his libido loss.

## 2010-12-06 ENCOUNTER — Ambulatory Visit (INDEPENDENT_AMBULATORY_CARE_PROVIDER_SITE_OTHER): Payer: Federal, State, Local not specified - PPO | Admitting: Licensed Clinical Social Worker

## 2010-12-06 DIAGNOSIS — F4310 Post-traumatic stress disorder, unspecified: Secondary | ICD-10-CM

## 2010-12-06 DIAGNOSIS — F331 Major depressive disorder, recurrent, moderate: Secondary | ICD-10-CM

## 2010-12-06 DIAGNOSIS — F411 Generalized anxiety disorder: Secondary | ICD-10-CM

## 2010-12-11 ENCOUNTER — Telehealth: Payer: Self-pay | Admitting: *Deleted

## 2010-12-11 MED ORDER — TESTOSTERONE 20.25 MG/ACT (1.62%) TD GEL: 4.0000 | TRANSDERMAL | Status: DC

## 2010-12-11 NOTE — Telephone Encounter (Signed)
rx sent

## 2010-12-12 ENCOUNTER — Other Ambulatory Visit: Payer: Self-pay | Admitting: Family Medicine

## 2010-12-20 ENCOUNTER — Ambulatory Visit (INDEPENDENT_AMBULATORY_CARE_PROVIDER_SITE_OTHER): Payer: Federal, State, Local not specified - PPO | Admitting: Licensed Clinical Social Worker

## 2010-12-20 DIAGNOSIS — F411 Generalized anxiety disorder: Secondary | ICD-10-CM

## 2010-12-20 DIAGNOSIS — F331 Major depressive disorder, recurrent, moderate: Secondary | ICD-10-CM

## 2010-12-20 DIAGNOSIS — F4310 Post-traumatic stress disorder, unspecified: Secondary | ICD-10-CM

## 2011-01-03 ENCOUNTER — Ambulatory Visit (INDEPENDENT_AMBULATORY_CARE_PROVIDER_SITE_OTHER): Payer: Federal, State, Local not specified - PPO | Admitting: Licensed Clinical Social Worker

## 2011-01-03 DIAGNOSIS — F331 Major depressive disorder, recurrent, moderate: Secondary | ICD-10-CM

## 2011-01-03 DIAGNOSIS — F411 Generalized anxiety disorder: Secondary | ICD-10-CM

## 2011-01-03 DIAGNOSIS — F4310 Post-traumatic stress disorder, unspecified: Secondary | ICD-10-CM

## 2011-01-17 ENCOUNTER — Ambulatory Visit (INDEPENDENT_AMBULATORY_CARE_PROVIDER_SITE_OTHER): Payer: Federal, State, Local not specified - PPO | Admitting: Licensed Clinical Social Worker

## 2011-01-17 DIAGNOSIS — F331 Major depressive disorder, recurrent, moderate: Secondary | ICD-10-CM

## 2011-01-17 DIAGNOSIS — F4310 Post-traumatic stress disorder, unspecified: Secondary | ICD-10-CM

## 2011-01-17 DIAGNOSIS — F411 Generalized anxiety disorder: Secondary | ICD-10-CM

## 2011-01-31 ENCOUNTER — Ambulatory Visit (INDEPENDENT_AMBULATORY_CARE_PROVIDER_SITE_OTHER): Payer: Federal, State, Local not specified - PPO | Admitting: Licensed Clinical Social Worker

## 2011-01-31 DIAGNOSIS — F411 Generalized anxiety disorder: Secondary | ICD-10-CM

## 2011-01-31 DIAGNOSIS — F331 Major depressive disorder, recurrent, moderate: Secondary | ICD-10-CM

## 2011-01-31 DIAGNOSIS — F4310 Post-traumatic stress disorder, unspecified: Secondary | ICD-10-CM

## 2011-02-07 ENCOUNTER — Telehealth: Payer: Self-pay | Admitting: Family Medicine

## 2011-02-07 NOTE — Telephone Encounter (Signed)
Refill request for Hydrocodon/Acetaminophn 10-660 mg take 1 po q6hrs prn. Pt last here on 12/04/10 and script last filled on 01/10/11.

## 2011-02-08 ENCOUNTER — Ambulatory Visit: Payer: Federal, State, Local not specified - PPO | Admitting: Licensed Clinical Social Worker

## 2011-02-09 MED ORDER — HYDROCODONE-ACETAMINOPHEN 10-660 MG PO TABS: 1.0000 | ORAL_TABLET | Freq: Four times a day (QID) | ORAL | Status: DC | PRN

## 2011-02-09 NOTE — Telephone Encounter (Signed)
Call in #60 with 5 rf 

## 2011-02-09 NOTE — Telephone Encounter (Signed)
rx called in

## 2011-02-14 ENCOUNTER — Ambulatory Visit (INDEPENDENT_AMBULATORY_CARE_PROVIDER_SITE_OTHER): Payer: Federal, State, Local not specified - PPO | Admitting: Licensed Clinical Social Worker

## 2011-02-14 DIAGNOSIS — F4310 Post-traumatic stress disorder, unspecified: Secondary | ICD-10-CM

## 2011-02-14 DIAGNOSIS — F411 Generalized anxiety disorder: Secondary | ICD-10-CM

## 2011-02-14 DIAGNOSIS — F331 Major depressive disorder, recurrent, moderate: Secondary | ICD-10-CM

## 2011-02-23 ENCOUNTER — Other Ambulatory Visit: Payer: Self-pay | Admitting: Family Medicine

## 2011-02-27 ENCOUNTER — Telehealth: Payer: Self-pay | Admitting: Family Medicine

## 2011-02-27 NOTE — Telephone Encounter (Signed)
Refill request for Androgel 1.62 % Pump, apply 4 pumps qd and pt last here on 12/04/10.

## 2011-02-27 NOTE — Telephone Encounter (Signed)
Open in error

## 2011-03-07 ENCOUNTER — Telehealth: Payer: Self-pay | Admitting: Family Medicine

## 2011-03-07 NOTE — Telephone Encounter (Signed)
Refill request for Androgel Pump 1.62 % apply 4 pumps qd and pt last here on 12/04/10.

## 2011-03-08 MED ORDER — TESTOSTERONE 20.25 MG/ACT (1.62%) TD GEL: 4.0000 | TRANSDERMAL | Status: DC

## 2011-03-08 NOTE — Telephone Encounter (Signed)
Okay for 6 months 

## 2011-03-08 NOTE — Telephone Encounter (Signed)
Script called in

## 2011-03-14 ENCOUNTER — Ambulatory Visit (INDEPENDENT_AMBULATORY_CARE_PROVIDER_SITE_OTHER): Payer: Federal, State, Local not specified - PPO | Admitting: Licensed Clinical Social Worker

## 2011-03-14 DIAGNOSIS — F411 Generalized anxiety disorder: Secondary | ICD-10-CM

## 2011-03-14 DIAGNOSIS — F4310 Post-traumatic stress disorder, unspecified: Secondary | ICD-10-CM

## 2011-03-14 DIAGNOSIS — F331 Major depressive disorder, recurrent, moderate: Secondary | ICD-10-CM

## 2011-03-19 ENCOUNTER — Telehealth: Payer: Self-pay | Admitting: Family Medicine

## 2011-03-19 NOTE — Telephone Encounter (Signed)
Pt back went out Thursday of last week. Pt says this is an on going issue and he has been referred to a specialist before and is wanting to know were he was referred in the past so he could call and make an appt. with them. Please contact pt.

## 2011-03-20 NOTE — Telephone Encounter (Signed)
He saw Dr. Donalee Citrin (a neurosurgeon) who said this is not a surgical issue. He then sent him to see Dr. Callie Fielding (pain specialist). I suggest he go back to see Dr. Murray Hodgkins

## 2011-03-21 ENCOUNTER — Telehealth: Payer: Self-pay | Admitting: Family Medicine

## 2011-03-21 DIAGNOSIS — M545 Low back pain: Secondary | ICD-10-CM

## 2011-03-21 NOTE — Telephone Encounter (Signed)
Pt called and he wants a referral to a Ortho surgeon, is this something you can do or does he need to be seen first?

## 2011-03-21 NOTE — Telephone Encounter (Signed)
Spoke with pt

## 2011-03-22 ENCOUNTER — Ambulatory Visit (INDEPENDENT_AMBULATORY_CARE_PROVIDER_SITE_OTHER): Payer: Federal, State, Local not specified - PPO | Admitting: Licensed Clinical Social Worker

## 2011-03-22 DIAGNOSIS — F411 Generalized anxiety disorder: Secondary | ICD-10-CM

## 2011-03-22 DIAGNOSIS — F4310 Post-traumatic stress disorder, unspecified: Secondary | ICD-10-CM

## 2011-03-22 DIAGNOSIS — F331 Major depressive disorder, recurrent, moderate: Secondary | ICD-10-CM

## 2011-03-22 NOTE — Telephone Encounter (Signed)
I have done the referral.

## 2011-04-18 ENCOUNTER — Ambulatory Visit (INDEPENDENT_AMBULATORY_CARE_PROVIDER_SITE_OTHER): Payer: Federal, State, Local not specified - PPO | Admitting: Licensed Clinical Social Worker

## 2011-04-18 DIAGNOSIS — F411 Generalized anxiety disorder: Secondary | ICD-10-CM

## 2011-04-18 DIAGNOSIS — F4310 Post-traumatic stress disorder, unspecified: Secondary | ICD-10-CM

## 2011-04-18 DIAGNOSIS — F331 Major depressive disorder, recurrent, moderate: Secondary | ICD-10-CM

## 2011-04-25 ENCOUNTER — Ambulatory Visit (INDEPENDENT_AMBULATORY_CARE_PROVIDER_SITE_OTHER): Payer: Federal, State, Local not specified - PPO | Admitting: Family Medicine

## 2011-04-25 ENCOUNTER — Ambulatory Visit (INDEPENDENT_AMBULATORY_CARE_PROVIDER_SITE_OTHER): Payer: Federal, State, Local not specified - PPO | Admitting: Licensed Clinical Social Worker

## 2011-04-25 DIAGNOSIS — F4310 Post-traumatic stress disorder, unspecified: Secondary | ICD-10-CM

## 2011-04-25 DIAGNOSIS — Z23 Encounter for immunization: Secondary | ICD-10-CM

## 2011-04-25 DIAGNOSIS — F411 Generalized anxiety disorder: Secondary | ICD-10-CM

## 2011-04-25 DIAGNOSIS — F331 Major depressive disorder, recurrent, moderate: Secondary | ICD-10-CM

## 2011-05-02 ENCOUNTER — Ambulatory Visit: Payer: Federal, State, Local not specified - PPO | Admitting: Licensed Clinical Social Worker

## 2011-05-04 ENCOUNTER — Ambulatory Visit (INDEPENDENT_AMBULATORY_CARE_PROVIDER_SITE_OTHER): Payer: Federal, State, Local not specified - PPO | Admitting: Licensed Clinical Social Worker

## 2011-05-04 DIAGNOSIS — F411 Generalized anxiety disorder: Secondary | ICD-10-CM

## 2011-05-04 DIAGNOSIS — F331 Major depressive disorder, recurrent, moderate: Secondary | ICD-10-CM

## 2011-05-04 DIAGNOSIS — F4310 Post-traumatic stress disorder, unspecified: Secondary | ICD-10-CM

## 2011-05-16 ENCOUNTER — Telehealth: Payer: Self-pay | Admitting: Family Medicine

## 2011-05-16 NOTE — Telephone Encounter (Signed)
We will discuss this at the cpx

## 2011-05-16 NOTE — Telephone Encounter (Signed)
Pt coming in on 06/12/11 for cpx and would like to know if pt needs to sch a med check prior to cpx, to discuss the new testosterone med that pt was put on, or can pt wait and discuss this during his cpx?

## 2011-05-21 NOTE — Telephone Encounter (Signed)
Spoke with pt

## 2011-05-23 ENCOUNTER — Ambulatory Visit (INDEPENDENT_AMBULATORY_CARE_PROVIDER_SITE_OTHER): Payer: Federal, State, Local not specified - PPO | Admitting: Licensed Clinical Social Worker

## 2011-05-23 DIAGNOSIS — F4310 Post-traumatic stress disorder, unspecified: Secondary | ICD-10-CM

## 2011-05-23 DIAGNOSIS — F411 Generalized anxiety disorder: Secondary | ICD-10-CM

## 2011-05-23 DIAGNOSIS — F331 Major depressive disorder, recurrent, moderate: Secondary | ICD-10-CM

## 2011-05-30 ENCOUNTER — Ambulatory Visit (INDEPENDENT_AMBULATORY_CARE_PROVIDER_SITE_OTHER): Payer: Federal, State, Local not specified - PPO | Admitting: Licensed Clinical Social Worker

## 2011-05-30 DIAGNOSIS — F411 Generalized anxiety disorder: Secondary | ICD-10-CM

## 2011-05-30 DIAGNOSIS — F4310 Post-traumatic stress disorder, unspecified: Secondary | ICD-10-CM

## 2011-05-30 DIAGNOSIS — F331 Major depressive disorder, recurrent, moderate: Secondary | ICD-10-CM

## 2011-06-05 ENCOUNTER — Other Ambulatory Visit (INDEPENDENT_AMBULATORY_CARE_PROVIDER_SITE_OTHER): Payer: Federal, State, Local not specified - PPO

## 2011-06-05 DIAGNOSIS — Z0000 Encounter for general adult medical examination without abnormal findings: Secondary | ICD-10-CM

## 2011-06-05 LAB — BASIC METABOLIC PANEL
Calcium: 10 mg/dL (ref 8.4–10.5)
Chloride: 102 mEq/L (ref 96–112)
Creatinine, Ser: 1.1 mg/dL (ref 0.4–1.5)
Sodium: 138 mEq/L (ref 135–145)

## 2011-06-05 LAB — HEPATIC FUNCTION PANEL
Albumin: 4.4 g/dL (ref 3.5–5.2)
Alkaline Phosphatase: 80 U/L (ref 39–117)
Bilirubin, Direct: 0.2 mg/dL (ref 0.0–0.3)

## 2011-06-05 LAB — CBC WITH DIFFERENTIAL/PLATELET
Basophils Relative: 0.3 % (ref 0.0–3.0)
Eosinophils Relative: 0.3 % (ref 0.0–5.0)
Hemoglobin: 16.5 g/dL (ref 13.0–17.0)
Lymphocytes Relative: 17.1 % (ref 12.0–46.0)
Monocytes Relative: 4.6 % (ref 3.0–12.0)
Neutro Abs: 5.1 10*3/uL (ref 1.4–7.7)
Neutrophils Relative %: 77.7 % — ABNORMAL HIGH (ref 43.0–77.0)
RBC: 6.09 Mil/uL — ABNORMAL HIGH (ref 4.22–5.81)
WBC: 6.6 10*3/uL (ref 4.5–10.5)

## 2011-06-05 LAB — POCT URINALYSIS DIPSTICK
Bilirubin, UA: NEGATIVE
Ketones, UA: NEGATIVE
Spec Grav, UA: 1.02
pH, UA: 7

## 2011-06-05 LAB — LIPID PANEL
Cholesterol: 240 mg/dL — ABNORMAL HIGH (ref 0–200)
HDL: 56.8 mg/dL (ref >39.00)
VLDL: 46.4 mg/dL — ABNORMAL HIGH (ref 0.0–40.0)

## 2011-06-05 LAB — TSH: TSH: 0.48 u[IU]/mL (ref 0.35–5.50)

## 2011-06-06 LAB — LDL CHOLESTEROL, DIRECT: Direct LDL: 104.8 mg/dL

## 2011-06-12 ENCOUNTER — Encounter: Payer: Federal, State, Local not specified - PPO | Admitting: Family Medicine

## 2011-06-12 NOTE — Progress Notes (Signed)
Quick Note:  Spoke with pt ______ 

## 2011-06-13 ENCOUNTER — Ambulatory Visit (INDEPENDENT_AMBULATORY_CARE_PROVIDER_SITE_OTHER): Payer: Federal, State, Local not specified - PPO | Admitting: Family Medicine

## 2011-06-13 ENCOUNTER — Ambulatory Visit (INDEPENDENT_AMBULATORY_CARE_PROVIDER_SITE_OTHER): Payer: Federal, State, Local not specified - PPO | Admitting: Licensed Clinical Social Worker

## 2011-06-13 ENCOUNTER — Encounter: Payer: Self-pay | Admitting: Family Medicine

## 2011-06-13 VITALS — BP 126/84 | HR 94 | Temp 98.7°F | Ht 65.25 in | Wt 190.0 lb

## 2011-06-13 DIAGNOSIS — R413 Other amnesia: Secondary | ICD-10-CM

## 2011-06-13 DIAGNOSIS — Z0000 Encounter for general adult medical examination without abnormal findings: Secondary | ICD-10-CM

## 2011-06-13 DIAGNOSIS — F4310 Post-traumatic stress disorder, unspecified: Secondary | ICD-10-CM

## 2011-06-13 DIAGNOSIS — F411 Generalized anxiety disorder: Secondary | ICD-10-CM

## 2011-06-13 DIAGNOSIS — F331 Major depressive disorder, recurrent, moderate: Secondary | ICD-10-CM

## 2011-06-13 NOTE — Progress Notes (Signed)
Subjective:    Patient ID: [name], male    DOB: October 30, 1945, 66 y.o.   MRN: [id number]  HPI 66 yr old male for a cpx. He feels fine in general. He admits to eating a poor diet over the past year and putting on a lot of weight. This was reflected in his labs, which showed an elevated glucose, elevated lipids, and elevated liver enzymes. He has now changed his diet and has actually lost 10 lbs in the past month. He still sees Dr. Raquel James for anxiety and PTSD, and she recently gave him Prazocin to take at bedtime. He took this only twice however, because he knew it is also used for BP and he wanted to ask me about it first. He has a BP cuff at home but does not use it. As far as the PTSD goes, he has actually improved a bit in the past few months. He recalls how the anniversary date for a horrible and traumatic day he went through in the service recently passed, and he handled it better than he has been able to do in years. He still has therapy with Judithe Modest also.    Review of Systems  Constitutional: Negative.   HENT: Negative.   Eyes: Negative.   Respiratory: Negative.   Cardiovascular: Negative.   Gastrointestinal: Negative.   Genitourinary: Negative.   Musculoskeletal: Negative.   Skin: Negative.   Neurological: Negative.   Hematological: Negative.   Psychiatric/Behavioral: Negative.        Objective:   Physical Exam  Constitutional: He is oriented to person, place, and time. He appears well-developed and well-nourished. No distress.  HENT:  Head: Normocephalic and atraumatic.  Right Ear: External ear normal.  Left Ear: External ear normal.  Nose: Nose normal.  Mouth/Throat: Oropharynx is clear and moist. No oropharyngeal exudate.  Eyes: Conjunctivae and EOM are normal. Pupils are equal, round, and reactive to light. Right eye exhibits no discharge. Left eye exhibits no discharge. No scleral icterus.  Neck: Neck supple. No JVD present. No tracheal deviation present. No  thyromegaly present.  Cardiovascular: Normal rate, regular rhythm, normal heart sounds and intact distal pulses.  Exam reveals no gallop and no friction rub.   No murmur heard.      EKG normal  Pulmonary/Chest: Effort normal and breath sounds normal. No respiratory distress. He has no wheezes. He has no rales. He exhibits no tenderness.  Abdominal: Soft. Bowel sounds are normal. He exhibits no distension and no mass. There is no tenderness. There is no rebound and no guarding.  Genitourinary: Rectum normal, prostate normal and penis normal. Guaiac negative stool. No penile tenderness.  Musculoskeletal: Normal range of motion. He exhibits no edema and no tenderness.  Lymphadenopathy:    He has no cervical adenopathy.  Neurological: He is alert and oriented to person, place, and time. He has normal reflexes. No cranial nerve deficit. He exhibits normal muscle tone. Coordination normal.  Skin: Skin is warm and dry. No rash noted. He is not diaphoretic. No erythema. No pallor.  Psychiatric: He has a normal mood and affect. His behavior is normal. Judgment and thought content normal.          Assessment & Plan:  Well exam. I told him to go ahead and try the Prazocin,because I do not think it will affect his BP at all. However he should start to check his BP at home 2-3 times a week. He will watch the diet. Recheck labs in 6  months

## 2011-06-20 ENCOUNTER — Ambulatory Visit (INDEPENDENT_AMBULATORY_CARE_PROVIDER_SITE_OTHER): Payer: Federal, State, Local not specified - PPO | Admitting: Family Medicine

## 2011-06-20 ENCOUNTER — Encounter: Payer: Self-pay | Admitting: Family Medicine

## 2011-06-20 ENCOUNTER — Ambulatory Visit (INDEPENDENT_AMBULATORY_CARE_PROVIDER_SITE_OTHER): Payer: Federal, State, Local not specified - PPO | Admitting: Licensed Clinical Social Worker

## 2011-06-20 VITALS — BP 120/70 | HR 95 | Temp 98.1°F | Wt 189.0 lb

## 2011-06-20 DIAGNOSIS — F411 Generalized anxiety disorder: Secondary | ICD-10-CM

## 2011-06-20 DIAGNOSIS — K921 Melena: Secondary | ICD-10-CM

## 2011-06-20 DIAGNOSIS — F331 Major depressive disorder, recurrent, moderate: Secondary | ICD-10-CM

## 2011-06-20 DIAGNOSIS — E291 Testicular hypofunction: Secondary | ICD-10-CM

## 2011-06-20 DIAGNOSIS — F4310 Post-traumatic stress disorder, unspecified: Secondary | ICD-10-CM

## 2011-06-20 MED ORDER — HYDROCORTISONE ACETATE 25 MG RE SUPP: 25.0000 mg | Freq: Three times a day (TID) | RECTAL | Status: AC

## 2011-06-20 NOTE — Progress Notes (Signed)
  Subjective:    Patient ID: [name], male    DOB: 06-12-1945, 66 y.o.   MRN: [id number]  HPI Here for 2 reasons. First he had a BM this morning which had some bright red blood in it. The BM was a little loose but not painful. He had a normal colonoscopy in 2009. Also he has been on Androgel since last summer. At first he felt good results from this as far as his libido,etc. However lately it has not been as effective.    Review of Systems  Constitutional: Negative.   Gastrointestinal: Positive for blood in stool. Negative for abdominal pain and rectal pain.       Objective:   Physical Exam  Constitutional: He appears well-developed and well-nourished.  Abdominal: Soft. Bowel sounds are normal. He exhibits no distension and no mass. There is no tenderness. There is no rebound and no guarding.  Genitourinary: Rectum normal and prostate normal.       His stool has obvious red blood in it           Assessment & Plan:  He probably has some internal hemorrhoids. Try Anusol HC suppositories. Check a testosterone level today

## 2011-06-22 NOTE — Progress Notes (Signed)
Quick Note:  Spoke with pt ______ 

## 2011-07-03 ENCOUNTER — Ambulatory Visit (INDEPENDENT_AMBULATORY_CARE_PROVIDER_SITE_OTHER): Payer: Federal, State, Local not specified - PPO | Admitting: Licensed Clinical Social Worker

## 2011-07-03 DIAGNOSIS — F411 Generalized anxiety disorder: Secondary | ICD-10-CM

## 2011-07-03 DIAGNOSIS — F331 Major depressive disorder, recurrent, moderate: Secondary | ICD-10-CM

## 2011-07-03 DIAGNOSIS — F4310 Post-traumatic stress disorder, unspecified: Secondary | ICD-10-CM

## 2011-07-18 ENCOUNTER — Ambulatory Visit (INDEPENDENT_AMBULATORY_CARE_PROVIDER_SITE_OTHER): Payer: Federal, State, Local not specified - PPO | Admitting: Licensed Clinical Social Worker

## 2011-07-18 DIAGNOSIS — F331 Major depressive disorder, recurrent, moderate: Secondary | ICD-10-CM

## 2011-07-18 DIAGNOSIS — F411 Generalized anxiety disorder: Secondary | ICD-10-CM

## 2011-07-18 DIAGNOSIS — F4310 Post-traumatic stress disorder, unspecified: Secondary | ICD-10-CM

## 2011-07-25 ENCOUNTER — Ambulatory Visit (INDEPENDENT_AMBULATORY_CARE_PROVIDER_SITE_OTHER): Payer: Federal, State, Local not specified - PPO | Admitting: Licensed Clinical Social Worker

## 2011-07-25 DIAGNOSIS — F4310 Post-traumatic stress disorder, unspecified: Secondary | ICD-10-CM

## 2011-07-25 DIAGNOSIS — F331 Major depressive disorder, recurrent, moderate: Secondary | ICD-10-CM

## 2011-07-25 DIAGNOSIS — F411 Generalized anxiety disorder: Secondary | ICD-10-CM

## 2011-07-26 ENCOUNTER — Other Ambulatory Visit: Payer: Self-pay | Admitting: Family Medicine

## 2011-07-29 ENCOUNTER — Other Ambulatory Visit: Payer: Self-pay | Admitting: Family Medicine

## 2011-07-30 ENCOUNTER — Other Ambulatory Visit: Payer: Self-pay

## 2011-07-30 MED ORDER — HYDROCODONE-ACETAMINOPHEN 10-660 MG PO TABS: 1.0000 | ORAL_TABLET | Freq: Four times a day (QID) | ORAL | Status: DC | PRN

## 2011-07-30 NOTE — Telephone Encounter (Signed)
Rx called in to pharmacy. 

## 2011-07-30 NOTE — Telephone Encounter (Signed)
Rx request for hydrocodone-acetaminophen 10-660. Take 1 tablet q6hr prn.  Rx last filled 02/09/11 #60 x 5 rf.  Pls advise.

## 2011-07-30 NOTE — Telephone Encounter (Signed)
Call in #60 with 5 rf 

## 2011-07-30 NOTE — Telephone Encounter (Signed)
Pt last seen 06/20/11.  Rx last filled 02/23/11 #90 x 4 rf.  Pls advise.

## 2011-08-13 ENCOUNTER — Telehealth: Payer: Self-pay | Admitting: Family Medicine

## 2011-08-13 NOTE — Telephone Encounter (Signed)
Ok x 1 month  Dr Clent Ridges out of office

## 2011-08-13 NOTE — Telephone Encounter (Signed)
Refill request for Androgel 1.62% gel pump apply 4 pumps qd and pt last here on 06/20/11.

## 2011-08-14 MED ORDER — TESTOSTERONE 20.25 MG/ACT (1.62%) TD GEL: 4.0000 | TRANSDERMAL | Status: DC

## 2011-08-14 NOTE — Telephone Encounter (Signed)
Script called in

## 2011-08-16 ENCOUNTER — Ambulatory Visit (INDEPENDENT_AMBULATORY_CARE_PROVIDER_SITE_OTHER): Payer: Federal, State, Local not specified - PPO | Admitting: Licensed Clinical Social Worker

## 2011-08-16 DIAGNOSIS — F4310 Post-traumatic stress disorder, unspecified: Secondary | ICD-10-CM

## 2011-08-16 DIAGNOSIS — F411 Generalized anxiety disorder: Secondary | ICD-10-CM

## 2011-08-16 DIAGNOSIS — F331 Major depressive disorder, recurrent, moderate: Secondary | ICD-10-CM

## 2011-08-22 ENCOUNTER — Other Ambulatory Visit: Payer: Self-pay | Admitting: Family Medicine

## 2011-08-29 ENCOUNTER — Ambulatory Visit (INDEPENDENT_AMBULATORY_CARE_PROVIDER_SITE_OTHER): Payer: Federal, State, Local not specified - PPO | Admitting: Licensed Clinical Social Worker

## 2011-08-29 DIAGNOSIS — F411 Generalized anxiety disorder: Secondary | ICD-10-CM

## 2011-08-29 DIAGNOSIS — F4310 Post-traumatic stress disorder, unspecified: Secondary | ICD-10-CM

## 2011-08-29 DIAGNOSIS — F331 Major depressive disorder, recurrent, moderate: Secondary | ICD-10-CM

## 2011-09-05 ENCOUNTER — Ambulatory Visit (INDEPENDENT_AMBULATORY_CARE_PROVIDER_SITE_OTHER): Payer: Federal, State, Local not specified - PPO | Admitting: Licensed Clinical Social Worker

## 2011-09-05 DIAGNOSIS — F4310 Post-traumatic stress disorder, unspecified: Secondary | ICD-10-CM

## 2011-09-05 DIAGNOSIS — F331 Major depressive disorder, recurrent, moderate: Secondary | ICD-10-CM

## 2011-09-05 DIAGNOSIS — F411 Generalized anxiety disorder: Secondary | ICD-10-CM

## 2011-09-18 ENCOUNTER — Telehealth: Payer: Self-pay | Admitting: Family Medicine

## 2011-09-18 NOTE — Telephone Encounter (Signed)
Refill request for Androgel 1.62% gel pump apply 4 pumps qd and pt last here on 06/20/11. 

## 2011-09-20 MED ORDER — TESTOSTERONE 20.25 MG/1.25GM (1.62%) TD GEL: 4.0000 "application " | Freq: Every day | TRANSDERMAL | Status: DC

## 2011-09-20 NOTE — Telephone Encounter (Signed)
I called in script 

## 2011-09-20 NOTE — Telephone Encounter (Signed)
Okay for 6 months 

## 2011-09-26 ENCOUNTER — Ambulatory Visit (INDEPENDENT_AMBULATORY_CARE_PROVIDER_SITE_OTHER): Payer: Medicare Other | Admitting: Licensed Clinical Social Worker

## 2011-09-26 DIAGNOSIS — F411 Generalized anxiety disorder: Secondary | ICD-10-CM

## 2011-09-26 DIAGNOSIS — F4310 Post-traumatic stress disorder, unspecified: Secondary | ICD-10-CM

## 2011-09-26 DIAGNOSIS — F331 Major depressive disorder, recurrent, moderate: Secondary | ICD-10-CM

## 2011-10-02 DIAGNOSIS — F4310 Post-traumatic stress disorder, unspecified: Secondary | ICD-10-CM | POA: Diagnosis not present

## 2011-10-03 ENCOUNTER — Ambulatory Visit (INDEPENDENT_AMBULATORY_CARE_PROVIDER_SITE_OTHER): Payer: Medicare Other | Admitting: Licensed Clinical Social Worker

## 2011-10-03 DIAGNOSIS — F331 Major depressive disorder, recurrent, moderate: Secondary | ICD-10-CM

## 2011-10-03 DIAGNOSIS — F411 Generalized anxiety disorder: Secondary | ICD-10-CM

## 2011-10-03 DIAGNOSIS — F4310 Post-traumatic stress disorder, unspecified: Secondary | ICD-10-CM | POA: Diagnosis not present

## 2011-10-10 ENCOUNTER — Ambulatory Visit (INDEPENDENT_AMBULATORY_CARE_PROVIDER_SITE_OTHER): Payer: Federal, State, Local not specified - PPO | Admitting: Licensed Clinical Social Worker

## 2011-10-10 DIAGNOSIS — F4310 Post-traumatic stress disorder, unspecified: Secondary | ICD-10-CM

## 2011-10-10 DIAGNOSIS — F331 Major depressive disorder, recurrent, moderate: Secondary | ICD-10-CM | POA: Diagnosis not present

## 2011-10-10 DIAGNOSIS — F411 Generalized anxiety disorder: Secondary | ICD-10-CM | POA: Diagnosis not present

## 2011-10-24 ENCOUNTER — Ambulatory Visit (INDEPENDENT_AMBULATORY_CARE_PROVIDER_SITE_OTHER): Payer: Medicare Other | Admitting: Licensed Clinical Social Worker

## 2011-10-24 DIAGNOSIS — F411 Generalized anxiety disorder: Secondary | ICD-10-CM

## 2011-10-24 DIAGNOSIS — F4310 Post-traumatic stress disorder, unspecified: Secondary | ICD-10-CM | POA: Diagnosis not present

## 2011-10-24 DIAGNOSIS — F331 Major depressive disorder, recurrent, moderate: Secondary | ICD-10-CM | POA: Diagnosis not present

## 2011-10-30 DIAGNOSIS — F4310 Post-traumatic stress disorder, unspecified: Secondary | ICD-10-CM | POA: Diagnosis not present

## 2011-11-07 ENCOUNTER — Ambulatory Visit (INDEPENDENT_AMBULATORY_CARE_PROVIDER_SITE_OTHER): Payer: Medicare Other | Admitting: Licensed Clinical Social Worker

## 2011-11-07 DIAGNOSIS — F331 Major depressive disorder, recurrent, moderate: Secondary | ICD-10-CM

## 2011-11-07 DIAGNOSIS — F411 Generalized anxiety disorder: Secondary | ICD-10-CM | POA: Diagnosis not present

## 2011-11-07 DIAGNOSIS — F4310 Post-traumatic stress disorder, unspecified: Secondary | ICD-10-CM

## 2011-11-09 ENCOUNTER — Encounter: Payer: Self-pay | Admitting: Family Medicine

## 2011-11-09 ENCOUNTER — Ambulatory Visit (INDEPENDENT_AMBULATORY_CARE_PROVIDER_SITE_OTHER): Payer: Medicare Other | Admitting: Family Medicine

## 2011-11-09 VITALS — BP 120/72 | HR 95 | Temp 98.5°F | Wt 184.0 lb

## 2011-11-09 DIAGNOSIS — N529 Male erectile dysfunction, unspecified: Secondary | ICD-10-CM

## 2011-11-09 DIAGNOSIS — I10 Essential (primary) hypertension: Secondary | ICD-10-CM

## 2011-11-09 DIAGNOSIS — E785 Hyperlipidemia, unspecified: Secondary | ICD-10-CM

## 2011-11-09 DIAGNOSIS — F329 Major depressive disorder, single episode, unspecified: Secondary | ICD-10-CM | POA: Diagnosis not present

## 2011-11-09 DIAGNOSIS — Z0000 Encounter for general adult medical examination without abnormal findings: Secondary | ICD-10-CM

## 2011-11-09 DIAGNOSIS — Z2911 Encounter for prophylactic immunotherapy for respiratory syncytial virus (RSV): Secondary | ICD-10-CM

## 2011-11-09 LAB — HEPATIC FUNCTION PANEL
ALT: 37 U/L (ref 0–53)
Alkaline Phosphatase: 64 U/L (ref 39–117)
Bilirubin, Direct: 0 mg/dL (ref 0.0–0.3)
Total Protein: 8.1 g/dL (ref 6.0–8.3)

## 2011-11-09 MED ORDER — SILDENAFIL CITRATE 100 MG PO TABS: 50.0000 mg | ORAL_TABLET | Freq: Every day | ORAL | Status: DC | PRN

## 2011-11-09 MED ORDER — TESTOSTERONE 20.25 MG/1.25GM (1.62%) TD GEL: 4.0000 "application " | Freq: Every day | TRANSDERMAL | Status: DC

## 2011-11-09 NOTE — Progress Notes (Signed)
  Subjective:    Patient ID: [name], male    DOB: 1946-03-20, 66 y.o.   MRN: [id number]  HPI Here for follow up. His depression is doing quite well, and he still sees Judithe Modest regularly. His arthritis is well controlled with exercise. He walks and does calisthenics every morning. His BP is stable. He is not satisfied with the duration of the erections he gets with Cialis however.    Review of Systems  Constitutional: Negative.   Respiratory: Negative.   Cardiovascular: Negative.   Psychiatric/Behavioral: Negative.        Objective:   Physical Exam  Constitutional: He appears well-developed and well-nourished.  Cardiovascular: Normal rate, regular rhythm, normal heart sounds and intact distal pulses.   Psychiatric: He has a normal mood and affect. His behavior is normal. Thought content normal.          Assessment & Plan:  Get a liver panel. Try Viagra instead.

## 2011-11-09 NOTE — Addendum Note (Signed)
Addended by: Aniceto Boss A on: 11/09/2011 10:33 AM   Modules accepted: Orders

## 2011-11-12 NOTE — Progress Notes (Signed)
Quick Note:  I left voice message with normal results. ______ 

## 2011-11-19 ENCOUNTER — Other Ambulatory Visit: Payer: Self-pay | Admitting: Family Medicine

## 2011-11-21 ENCOUNTER — Ambulatory Visit (INDEPENDENT_AMBULATORY_CARE_PROVIDER_SITE_OTHER): Payer: Medicare Other | Admitting: Licensed Clinical Social Worker

## 2011-11-21 DIAGNOSIS — F331 Major depressive disorder, recurrent, moderate: Secondary | ICD-10-CM | POA: Diagnosis not present

## 2011-11-21 DIAGNOSIS — F4310 Post-traumatic stress disorder, unspecified: Secondary | ICD-10-CM

## 2011-11-21 DIAGNOSIS — F411 Generalized anxiety disorder: Secondary | ICD-10-CM | POA: Diagnosis not present

## 2011-12-05 ENCOUNTER — Ambulatory Visit (INDEPENDENT_AMBULATORY_CARE_PROVIDER_SITE_OTHER): Payer: Federal, State, Local not specified - PPO | Admitting: Licensed Clinical Social Worker

## 2011-12-05 DIAGNOSIS — F4310 Post-traumatic stress disorder, unspecified: Secondary | ICD-10-CM

## 2011-12-05 DIAGNOSIS — F411 Generalized anxiety disorder: Secondary | ICD-10-CM | POA: Diagnosis not present

## 2011-12-05 DIAGNOSIS — F331 Major depressive disorder, recurrent, moderate: Secondary | ICD-10-CM

## 2011-12-11 DIAGNOSIS — F4310 Post-traumatic stress disorder, unspecified: Secondary | ICD-10-CM | POA: Diagnosis not present

## 2011-12-12 ENCOUNTER — Ambulatory Visit (INDEPENDENT_AMBULATORY_CARE_PROVIDER_SITE_OTHER): Payer: Medicare Other | Admitting: Licensed Clinical Social Worker

## 2011-12-12 DIAGNOSIS — F411 Generalized anxiety disorder: Secondary | ICD-10-CM | POA: Diagnosis not present

## 2011-12-12 DIAGNOSIS — F4310 Post-traumatic stress disorder, unspecified: Secondary | ICD-10-CM | POA: Diagnosis not present

## 2011-12-12 DIAGNOSIS — F331 Major depressive disorder, recurrent, moderate: Secondary | ICD-10-CM

## 2011-12-18 ENCOUNTER — Ambulatory Visit (INDEPENDENT_AMBULATORY_CARE_PROVIDER_SITE_OTHER): Payer: Medicare Other | Admitting: Licensed Clinical Social Worker

## 2011-12-18 DIAGNOSIS — F331 Major depressive disorder, recurrent, moderate: Secondary | ICD-10-CM

## 2011-12-18 DIAGNOSIS — F4310 Post-traumatic stress disorder, unspecified: Secondary | ICD-10-CM | POA: Diagnosis not present

## 2011-12-18 DIAGNOSIS — F411 Generalized anxiety disorder: Secondary | ICD-10-CM

## 2011-12-26 ENCOUNTER — Ambulatory Visit (INDEPENDENT_AMBULATORY_CARE_PROVIDER_SITE_OTHER): Payer: Medicare Other | Admitting: Licensed Clinical Social Worker

## 2011-12-26 DIAGNOSIS — F411 Generalized anxiety disorder: Secondary | ICD-10-CM

## 2011-12-26 DIAGNOSIS — F4310 Post-traumatic stress disorder, unspecified: Secondary | ICD-10-CM

## 2011-12-26 DIAGNOSIS — F331 Major depressive disorder, recurrent, moderate: Secondary | ICD-10-CM | POA: Diagnosis not present

## 2012-01-09 ENCOUNTER — Ambulatory Visit (INDEPENDENT_AMBULATORY_CARE_PROVIDER_SITE_OTHER): Payer: Medicare Other | Admitting: Licensed Clinical Social Worker

## 2012-01-09 DIAGNOSIS — F331 Major depressive disorder, recurrent, moderate: Secondary | ICD-10-CM | POA: Diagnosis not present

## 2012-01-09 DIAGNOSIS — F411 Generalized anxiety disorder: Secondary | ICD-10-CM

## 2012-01-09 DIAGNOSIS — F4310 Post-traumatic stress disorder, unspecified: Secondary | ICD-10-CM

## 2012-01-14 ENCOUNTER — Other Ambulatory Visit: Payer: Self-pay | Admitting: Family Medicine

## 2012-01-15 ENCOUNTER — Telehealth: Payer: Self-pay | Admitting: *Deleted

## 2012-01-15 NOTE — Telephone Encounter (Signed)
Rx last filled 90x 4 rf on 07/29/11.  Pt last seen 11/09/11.  Pls advise.

## 2012-01-15 NOTE — Telephone Encounter (Signed)
Patient is requesting a refill of Hydrcodone-acetaminophin 10-660. Last office visit was 11/09/11. Is this okay to fill?

## 2012-01-16 MED ORDER — HYDROCODONE-ACETAMINOPHEN 10-660 MG PO TABS: 1.0000 | ORAL_TABLET | Freq: Four times a day (QID) | ORAL | Status: DC | PRN

## 2012-01-16 NOTE — Telephone Encounter (Signed)
Rx called in to pharmacy. 

## 2012-01-16 NOTE — Telephone Encounter (Signed)
Call in #60 with 5 rf 

## 2012-05-20 ENCOUNTER — Ambulatory Visit (INDEPENDENT_AMBULATORY_CARE_PROVIDER_SITE_OTHER): Payer: Medicare Other | Admitting: Family Medicine

## 2012-05-20 ENCOUNTER — Encounter: Payer: Self-pay | Admitting: Family Medicine

## 2012-05-20 VITALS — BP 138/78 | HR 96 | Temp 97.4°F | Wt 190.0 lb

## 2012-05-20 DIAGNOSIS — N410 Acute prostatitis: Secondary | ICD-10-CM

## 2012-05-20 DIAGNOSIS — I10 Essential (primary) hypertension: Secondary | ICD-10-CM | POA: Diagnosis not present

## 2012-05-20 DIAGNOSIS — N529 Male erectile dysfunction, unspecified: Secondary | ICD-10-CM

## 2012-05-20 LAB — POCT URINALYSIS DIPSTICK
Spec Grav, UA: 1.025
Urobilinogen, UA: 0.2
pH, UA: 5

## 2012-05-20 MED ORDER — CIPROFLOXACIN HCL 500 MG PO TABS: 500.0000 mg | ORAL_TABLET | Freq: Two times a day (BID) | ORAL | Status: DC

## 2012-05-20 MED ORDER — TAMSULOSIN HCL 0.4 MG PO CAPS: 0.8000 mg | ORAL_CAPSULE | Freq: Two times a day (BID) | ORAL | Status: DC

## 2012-05-20 MED ORDER — CYCLOBENZAPRINE HCL 10 MG PO TABS: 10.0000 mg | ORAL_TABLET | Freq: Three times a day (TID) | ORAL | Status: DC | PRN

## 2012-05-20 MED ORDER — LOSARTAN POTASSIUM 50 MG PO TABS: 50.0000 mg | ORAL_TABLET | Freq: Every day | ORAL | Status: DC

## 2012-05-20 NOTE — Progress Notes (Signed)
  Subjective:    Patient ID: [name], male    DOB: 07/24/1945, 67 y.o.   MRN: [id number]  HPI Here for several issues. First he has had trouble urinating for one month. He feels like he cannot totally empty his bladder and there is some urgency to go. No burning or pain. He has had trouble maintaining erections even with Viagra, and he knows that certain BP meds can make this worse. He has run out of Flomax.    Review of Systems  Constitutional: Negative.   Genitourinary: Positive for urgency, frequency and difficulty urinating. Negative for dysuria, hematuria and flank pain.       Objective:   Physical Exam  Constitutional: He appears well-developed and well-nourished.  Cardiovascular: Normal rate, regular rhythm, normal heart sounds and intact distal pulses.   Pulmonary/Chest: Effort normal and breath sounds normal.  Abdominal: Soft. Bowel sounds are normal. He exhibits no distension and no mass. There is no tenderness. There is no rebound and no guarding.          Assessment & Plan:  We will switch from Norvasc to Losartan for his HTN. Given Cipro for the prostatitis. Refilled Flomax. He is past due seeing his Urologist.

## 2012-05-20 NOTE — Addendum Note (Signed)
Addended by: Aniceto Boss A on: 05/20/2012 12:47 PM   Modules accepted: Orders

## 2012-06-08 ENCOUNTER — Other Ambulatory Visit: Payer: Self-pay | Admitting: Family Medicine

## 2012-06-10 MED ORDER — HYDROCODONE-ACETAMINOPHEN 10-325 MG PO TABS: 1.0000 | ORAL_TABLET | Freq: Four times a day (QID) | ORAL | Status: DC | PRN

## 2012-06-10 NOTE — Telephone Encounter (Signed)
Change to Vicodin 10/325 , call in #60 with 5 rf

## 2012-06-29 ENCOUNTER — Emergency Department (HOSPITAL_COMMUNITY): Payer: Medicare Other

## 2012-06-29 ENCOUNTER — Encounter (HOSPITAL_COMMUNITY): Payer: Self-pay | Admitting: *Deleted

## 2012-06-29 ENCOUNTER — Emergency Department (HOSPITAL_COMMUNITY)
Admission: EM | Admit: 2012-06-29 | Discharge: 2012-06-29 | Disposition: A | Discharge status: Home / Self Care | Payer: Medicare Other | Attending: Emergency Medicine | Admitting: Emergency Medicine

## 2012-06-29 DIAGNOSIS — Z87891 Personal history of nicotine dependence: Secondary | ICD-10-CM | POA: Insufficient documentation

## 2012-06-29 DIAGNOSIS — Z87448 Personal history of other diseases of urinary system: Secondary | ICD-10-CM | POA: Diagnosis not present

## 2012-06-29 DIAGNOSIS — Z79899 Other long term (current) drug therapy: Secondary | ICD-10-CM | POA: Diagnosis not present

## 2012-06-29 DIAGNOSIS — F4310 Post-traumatic stress disorder, unspecified: Secondary | ICD-10-CM | POA: Insufficient documentation

## 2012-06-29 DIAGNOSIS — R197 Diarrhea, unspecified: Secondary | ICD-10-CM | POA: Diagnosis not present

## 2012-06-29 DIAGNOSIS — R109 Unspecified abdominal pain: Secondary | ICD-10-CM | POA: Diagnosis not present

## 2012-06-29 DIAGNOSIS — F411 Generalized anxiety disorder: Secondary | ICD-10-CM | POA: Diagnosis not present

## 2012-06-29 DIAGNOSIS — F329 Major depressive disorder, single episode, unspecified: Secondary | ICD-10-CM | POA: Diagnosis not present

## 2012-06-29 DIAGNOSIS — K5732 Diverticulitis of large intestine without perforation or abscess without bleeding: Secondary | ICD-10-CM | POA: Insufficient documentation

## 2012-06-29 DIAGNOSIS — Z8739 Personal history of other diseases of the musculoskeletal system and connective tissue: Secondary | ICD-10-CM | POA: Insufficient documentation

## 2012-06-29 DIAGNOSIS — R110 Nausea: Secondary | ICD-10-CM | POA: Diagnosis not present

## 2012-06-29 DIAGNOSIS — R1032 Left lower quadrant pain: Secondary | ICD-10-CM | POA: Diagnosis not present

## 2012-06-29 DIAGNOSIS — F3289 Other specified depressive episodes: Secondary | ICD-10-CM | POA: Insufficient documentation

## 2012-06-29 DIAGNOSIS — Z8619 Personal history of other infectious and parasitic diseases: Secondary | ICD-10-CM | POA: Insufficient documentation

## 2012-06-29 DIAGNOSIS — I10 Essential (primary) hypertension: Secondary | ICD-10-CM | POA: Insufficient documentation

## 2012-06-29 DIAGNOSIS — R112 Nausea with vomiting, unspecified: Secondary | ICD-10-CM | POA: Diagnosis not present

## 2012-06-29 DIAGNOSIS — K5792 Diverticulitis of intestine, part unspecified, without perforation or abscess without bleeding: Secondary | ICD-10-CM

## 2012-06-29 LAB — CBC
HCT: 42.7 % (ref 39.0–52.0)
MCHC: 34.2 g/dL (ref 30.0–36.0)
MCV: 78.5 fL (ref 78.0–100.0)
Platelets: 127 10*3/uL — ABNORMAL LOW (ref 150–400)
RDW: 13.3 % (ref 11.5–15.5)
WBC: 7 10*3/uL (ref 4.0–10.5)

## 2012-06-29 LAB — BASIC METABOLIC PANEL
BUN: 17 mg/dL (ref 6–23)
Calcium: 9.5 mg/dL (ref 8.4–10.5)
Chloride: 98 mEq/L (ref 96–112)
Creatinine, Ser: 0.95 mg/dL (ref 0.50–1.35)
GFR calc Af Amer: >90 mL/min (ref >90)
GFR calc non Af Amer: 85 mL/min — ABNORMAL LOW (ref >90)

## 2012-06-29 MED ORDER — METRONIDAZOLE IN NACL 5-0.79 MG/ML-% IV SOLN
500.0000 mg | Freq: Once | INTRAVENOUS | Status: AC
  Administered 2012-06-29: 500 mg via INTRAVENOUS
  Filled 2012-06-29: qty 100

## 2012-06-29 MED ORDER — CIPROFLOXACIN HCL 500 MG PO TABS: 500.0000 mg | ORAL_TABLET | Freq: Two times a day (BID) | ORAL | Status: DC

## 2012-06-29 MED ORDER — ONDANSETRON HCL 4 MG/2ML IJ SOLN
4.0000 mg | Freq: Once | INTRAMUSCULAR | Status: AC
  Administered 2012-06-29: 4 mg via INTRAVENOUS
  Filled 2012-06-29: qty 2

## 2012-06-29 MED ORDER — SODIUM CHLORIDE 0.9 % IV SOLN
1000.0000 mL | Freq: Once | INTRAVENOUS | Status: AC
  Administered 2012-06-29: 1000 mL via INTRAVENOUS

## 2012-06-29 MED ORDER — ONDANSETRON HCL 4 MG PO TABS: 4.0000 mg | ORAL_TABLET | Freq: Three times a day (TID) | ORAL | Status: DC | PRN

## 2012-06-29 MED ORDER — METRONIDAZOLE 500 MG PO TABS: 500.0000 mg | ORAL_TABLET | Freq: Two times a day (BID) | ORAL | Status: DC

## 2012-06-29 MED ORDER — SODIUM CHLORIDE 0.9 % IV BOLUS (SEPSIS)
1000.0000 mL | INTRAVENOUS | Status: AC
  Administered 2012-06-29: 1000 mL via INTRAVENOUS

## 2012-06-29 MED ORDER — HYDROCODONE-ACETAMINOPHEN 5-325 MG PO TABS: 2.0000 | ORAL_TABLET | Freq: Four times a day (QID) | ORAL | Status: DC | PRN

## 2012-06-29 MED ORDER — IOHEXOL 300 MG/ML  SOLN
50.0000 mL | Freq: Once | INTRAMUSCULAR | Status: AC | PRN
  Administered 2012-06-29: 50 mL via ORAL

## 2012-06-29 MED ORDER — IOHEXOL 300 MG/ML  SOLN
100.0000 mL | Freq: Once | INTRAMUSCULAR | Status: AC | PRN
  Administered 2012-06-29: 100 mL via INTRAVENOUS

## 2012-06-29 MED ORDER — SODIUM CHLORIDE 0.9 % IV SOLN
1000.0000 mL | INTRAVENOUS | Status: DC
  Administered 2012-06-29 (×2): 1000 mL via INTRAVENOUS

## 2012-06-29 MED ORDER — CIPROFLOXACIN IN D5W 400 MG/200ML IV SOLN
400.0000 mg | Freq: Once | INTRAVENOUS | Status: AC
  Administered 2012-06-29: 400 mg via INTRAVENOUS
  Filled 2012-06-29: qty 200

## 2012-06-29 NOTE — ED Notes (Signed)
Nausea and diarrhea for the past 3-4 days

## 2012-06-29 NOTE — ED Provider Notes (Signed)
History     CSN: [id number]  Arrival date & time 06/29/12  0416   First MD Initiated Contact with Patient 06/29/12 0600      Chief Complaint  Patient presents with  . Diarrhea    (Consider location/radiation/quality/duration/timing/severity/associated sxs/prior treatment) HPI Comments: This is a 67 year old male, past medical history remarkable for BPH, who presents emergency department with chief complaint of diarrhea x4 days. Patient states that he has had some associated nausea and vomiting as well. States that he was recently treated for prostatitis, with antibiotics, but does not remember which ones. States that he has not tried anything for the diarrhea. States that he has had several episodes of diarrhea in the morning. Additionally he complains of some crampy abdominal pain. He states that his level of discomfort is moderate.  He had a colonoscopy 2 years ago, and was found to have some diverticula.  The history is provided by the patient. No language interpreter was used.    Past Medical History  Diagnosis Date  . BPH with urinary obstruction   . ED (erectile dysfunction)   . Hypertension   . Arthritis   . PTSD (post-traumatic stress disorder)     sees Dr. Janit Pagan  . Chickenpox   . Depression     sees Dr. Raquel James and Judithe Modest  . Anxiety     sees Dr. Janit Pagan and Judithe Modest    Past Surgical History  Procedure Laterality Date  . Lumbar fusion    . Colonoscopy  10-10-07    per Dr. Arlyce Dice, clear, repeat in 10 yrs     Family History  Problem Relation Age of Onset  . Arthritis    . Coronary artery disease    . Diabetes    . Hyperlipidemia    . Hypertension    . Kidney disease      History  Substance Use Topics  . Smoking status: Former Games developer  . Smokeless tobacco: Never Used  . Alcohol Use: No      Review of Systems  All other systems reviewed and are negative.    Allergies  Abilify  Home Medications   Current Outpatient Rx  Name   Route  Sig  Dispense  Refill  . cyclobenzaprine (FLEXERIL) 10 MG tablet   Oral   Take 1 tablet (10 mg total) by mouth 3 (three) times daily as needed for muscle spasms.   90 tablet   5   . gabapentin (NEURONTIN) 300 MG capsule      TAKE ONE CAPSULE BY MOUTH 3 TIMES A DAY   90 capsule   11   . hydrochlorothiazide (HYDRODIURIL) 25 MG tablet      TAKE 1 TABLET EVERY DAY   30 tablet   11   . HYDROcodone-acetaminophen (NORCO) 10-325 MG per tablet   Oral   Take 1 tablet by mouth every 6 (six) hours as needed for pain.   60 tablet   5   . losartan (COZAAR) 50 MG tablet   Oral   Take 1 tablet (50 mg total) by mouth daily.   90 tablet   3   . Tamsulosin HCl (FLOMAX) 0.4 MG CAPS   Oral   Take 2 capsules (0.8 mg total) by mouth 2 (two) times daily at 10 AM and 5 PM.   60 capsule   11     BP 135/75  Pulse 109  Temp(Src) 97.8 F (36.6 C) (Oral)  Resp 16  SpO2 98%  Physical  Exam  Nursing note and vitals reviewed. Constitutional: He is oriented to person, place, and time. He appears well-developed and well-nourished.  HENT:  Head: Normocephalic and atraumatic.  Nose: Nose normal.  Mouth/Throat: Oropharynx is clear and moist. No oropharyngeal exudate.  Eyes: Conjunctivae and EOM are normal. Pupils are equal, round, and reactive to light. Right eye exhibits no discharge. Left eye exhibits no discharge. No scleral icterus.  Neck: Normal range of motion. Neck supple. No JVD present.  Cardiovascular: Normal rate, regular rhythm, normal heart sounds and intact distal pulses.  Exam reveals no gallop and no friction rub.   No murmur heard. Pulmonary/Chest: Effort normal and breath sounds normal. No respiratory distress. He has no wheezes. He has no rales. He exhibits no tenderness.  Abdominal: Soft. Bowel sounds are normal. He exhibits no distension and no mass. There is no tenderness. There is no rebound and no guarding.  Left lower quadrant tenderness, no McBurney point  tenderness, Murphy's sign, or peritoneal signs.  Musculoskeletal: Normal range of motion. He exhibits no edema and no tenderness.  Neurological: He is alert and oriented to person, place, and time. He has normal reflexes.  Skin: Skin is warm and dry.  Psychiatric: He has a normal mood and affect. His behavior is normal. Judgment and thought content normal.    ED Course  Procedures (including critical care time)  Labs Reviewed  CBC - Abnormal; Notable for the following:    Platelets 127 (*)    All other components within normal limits  BASIC METABOLIC PANEL - Abnormal; Notable for the following:    Sodium 129 (*)    Glucose, Bld 188 (*)    GFR calc non Af Amer 85 (*)    All other components within normal limits   Results for orders placed during the hospital encounter of 06/29/12  CBC      Result Value Range   WBC 7.0  4.0 - 10.5 K/uL   RBC 5.44  4.22 - 5.81 MIL/uL   Hemoglobin 14.6  13.0 - 17.0 g/dL   HCT 45.4  09.8 - 11.9 %   MCV 78.5  78.0 - 100.0 fL   MCH 26.8  26.0 - 34.0 pg   MCHC 34.2  30.0 - 36.0 g/dL   RDW 14.7  82.9 - 56.2 %   Platelets 127 (*) 150 - 400 K/uL  BASIC METABOLIC PANEL      Result Value Range   Sodium 129 (*) 135 - 145 mEq/L   Potassium 4.7  3.5 - 5.1 mEq/L   Chloride 98  96 - 112 mEq/L   CO2 24  19 - 32 mEq/L   Glucose, Bld 188 (*) 70 - 99 mg/dL   BUN 17  6 - 23 mg/dL   Creatinine, Ser 1.30  0.50 - 1.35 mg/dL   Calcium 9.5  8.4 - 86.5 mg/dL   GFR calc non Af Amer 85 (*) >90 mL/min   GFR calc Af Amer >90  >90 mL/min   Ct Abdomen Pelvis W Contrast  06/29/2012  *RADIOLOGY REPORT*  Clinical Data: 67 year old male with left abdominal and pelvic pain with nausea.  CT ABDOMEN AND PELVIS WITH CONTRAST  Technique:  Multidetector CT imaging of the abdomen and pelvis was performed following the standard protocol during bolus administration of intravenous contrast.  Contrast: 100 ml intravenous Omnipaque-300  Comparison: 05/05/2010 CT  Findings: Mild bibasilar  atelectasis/scarring noted.  The liver, spleen, pancreas, adrenal glands, gallbladder and kidneys are unremarkable.  There is  mild wall thickening of the proximal sigmoid colon with minimal adjacent inflammation likely representing mild diverticulitis.  There is no evidence of abscess, pneumoperitoneum or bowel obstruction.  The appendix and bladder are unremarkable. No free fluid, enlarged lymph nodes, biliary dilation or abdominal aortic aneurysm identified. Prostate enlargement is present. No acute or suspicious bony abnormalities are identified. Post-traumatic/surgical changes within the lower lumbar spine/sacrum again noted.  IMPRESSION: Wall thickening and mild adjacent inflammation of the proximal sigmoid colon likely representing mild diverticulitis.  No evidence of abscess or pneumoperitoneum.  Clinical or other follow-up is recommended to exclude other processes.   Original Report Authenticated By: Harmon Pier, M.D.       1. Diverticulitis   2. Diarrhea   3. Nausea       MDM  67 year old male with diarrhea, and left lower quadrant tenderness, considering diverticulitis versus C. difficile colitis versus viral gastroenteritis. Patient has been given IV fluids in the emergency department. The pain is controlled at this time.   9:27 AM CT findings are consistent with diverticulitis. Patient has good followup, and this appears to be a mild case. I'm going to give the patient another liter of fluid, and will start the patient's Cipro and metronidazole IV while in the emergency department. I'm going to discharge the patient following the antibiotics, and will have him followup with Dr. Clent Ridges. I discussed the patient and the treatment plan with Dr. Lorenso Courier, who agrees that as long as the patient has proper followup, he may be discharged. Patient understands and agrees with the plan. He is stable and ready for discharge.       Roxy Horseman, PA-C 06/29/12 [phone]

## 2012-06-29 NOTE — ED Notes (Signed)
Patient transported to CT 

## 2012-06-30 ENCOUNTER — Ambulatory Visit: Payer: Medicare Other | Admitting: Family Medicine

## 2012-06-30 NOTE — ED Provider Notes (Signed)
Medical screening examination/treatment/procedure(s) were performed by non-physician practitioner and as supervising physician I was immediately available for consultation/collaboration.   Lyanne Co, MD 06/30/12 [phone]

## 2012-07-01 ENCOUNTER — Ambulatory Visit (INDEPENDENT_AMBULATORY_CARE_PROVIDER_SITE_OTHER): Payer: Medicare Other | Admitting: Family Medicine

## 2012-07-01 ENCOUNTER — Encounter: Payer: Self-pay | Admitting: Family Medicine

## 2012-07-01 VITALS — BP 138/80 | HR 84 | Temp 98.5°F | Wt 192.0 lb

## 2012-07-01 DIAGNOSIS — K5732 Diverticulitis of large intestine without perforation or abscess without bleeding: Secondary | ICD-10-CM | POA: Diagnosis not present

## 2012-07-01 NOTE — Progress Notes (Signed)
  Subjective:    Patient ID: [name], male    DOB: August 23, 1945, 67 y.o.   MRN: [id number]  HPI Here to follow up an ER visit on 06-29-12 for sigmoid diverticulitis. He had diarrhea and LLQ pain. His labs were normal with a WBC of 7.0, but a CT confirmed the diagnosis. He has been on Cipro and Flagyl, and he feels much better. He had a formed BM this am and has no more pain at all.    Review of Systems  Constitutional: Negative.   Gastrointestinal: Negative.   Genitourinary: Negative.        Objective:   Physical Exam  Constitutional: He appears well-developed and well-nourished.  Cardiovascular: Normal rate, regular rhythm, normal heart sounds and intact distal pulses.   Pulmonary/Chest: Effort normal and breath sounds normal.  Abdominal: Soft. Bowel sounds are normal. He exhibits no distension and no mass. There is no tenderness. There is no rebound and no guarding.          Assessment & Plan:  Resolving diverticulitis. Finish out the antibiotics and recheck prn

## 2012-08-04 ENCOUNTER — Other Ambulatory Visit: Payer: Self-pay | Admitting: Family Medicine

## 2012-08-05 ENCOUNTER — Telehealth: Payer: Self-pay | Admitting: Family Medicine

## 2012-08-05 MED ORDER — HYDROCHLOROTHIAZIDE 25 MG PO TABS: 25.0000 mg | ORAL_TABLET | Freq: Every day | ORAL | Status: DC

## 2012-08-05 NOTE — Telephone Encounter (Signed)
Pt requested a 90 day supply of HCTZ and I did send script e-scribe

## 2012-10-09 ENCOUNTER — Telehealth: Payer: Self-pay | Admitting: Family Medicine

## 2012-10-09 NOTE — Telephone Encounter (Signed)
Have him see me tomorrow

## 2012-10-09 NOTE — Telephone Encounter (Signed)
Patient Information:  Caller Name: Oreste  Phone: [phone]  Patient: [name], [name]  Gender: Male  DOB: 06-25-1945  Age: 67 Years  PCP: Gershon Crane [facility])  Office Follow Up:  Does the office need to follow up with this patient?: Yes  Instructions For The Office: OFFICE PLEASE FOLLOW UP WITH PT REGARDING EPISODED OF DIZZINESS.  PT DOES TAKE MEDICATION FOR HTN AND HAD MED CHANGE IN JAN 2014.  RN Note:  pt reports the episodes occur when he is bending over straightenin up or if he gets up too fast. Pt denies any dizziness.  Pt had a blood pressure medication change in January 2014.  Symptoms  Reason For Call & Symptoms: caller reports he is feeling lightheaded, dizzy and seeing spots.  Caller reports this has been happening for a couple of weeks:  happening 1-2 times per day  Reviewed Health History In EMR: Yes  Reviewed Medications In EMR: Yes  Reviewed Allergies In EMR: Yes  Reviewed Surgeries / Procedures: Yes  Date of Onset of Symptoms: Unknown  Guideline(s) Used:  Dizziness  Disposition Per Guideline:   Discuss with PCP and Callback by Nurse Today  Reason For Disposition Reached:   Taking a medicine that could cause dizziness (e.g., blood pressure medications, diuretics)  Advice Given:  Drink Fluids:  Drink several glasses of fruit juice, other clear fluids, or water. This will improve hydration and blood glucose. If you have a fever or have had heat exposure, make sure the fluids are cold.  Rest for 1-2 Hours:  Lie down with feet elevated for 1 hour. This will improve blood flow and increase blood flow to the brain.  Stand Up Slowly:  In the mornings, sit up for a few minutes before you stand up. That will help your blood flow make the adjustment.  If you have to stand up for long periods of time, contract and relax your leg muscles to help pump the blood back to the heart.  Sit down or lie down if you feel dizzy.  Call Back If:  You become  worse.  Patient Will Follow Care Advice:  YES

## 2012-10-09 NOTE — Telephone Encounter (Signed)
I spoke with pt and he is going to schedule the office visit. 

## 2012-10-10 ENCOUNTER — Encounter: Payer: Self-pay | Admitting: Family Medicine

## 2012-10-10 ENCOUNTER — Ambulatory Visit (INDEPENDENT_AMBULATORY_CARE_PROVIDER_SITE_OTHER): Payer: Medicare Other | Admitting: Family Medicine

## 2012-10-10 VITALS — BP 130/80 | HR 97 | Temp 98.2°F | Wt 187.0 lb

## 2012-10-10 DIAGNOSIS — R350 Frequency of micturition: Secondary | ICD-10-CM

## 2012-10-10 DIAGNOSIS — N529 Male erectile dysfunction, unspecified: Secondary | ICD-10-CM | POA: Diagnosis not present

## 2012-10-10 DIAGNOSIS — I10 Essential (primary) hypertension: Secondary | ICD-10-CM | POA: Diagnosis not present

## 2012-10-10 LAB — POCT URINALYSIS DIPSTICK
Glucose, UA: NEGATIVE
Leukocytes, UA: NEGATIVE
Nitrite, UA: NEGATIVE
Spec Grav, UA: 1.025
Urobilinogen, UA: 0.2

## 2012-10-10 MED ORDER — TESTOSTERONE 20.25 MG/ACT (1.62%) TD GEL: 4.0000 | Freq: Every day | TRANSDERMAL | Status: DC

## 2012-10-10 NOTE — Progress Notes (Signed)
  Subjective:    Patient ID: [name], male    DOB: Jun 17, 1945, 67 y.o.   MRN: [id number]  HPI Here for several things. He has had more trouble with low libido and poor erections lately. He used Androgel a few years ago and was pleased with it, so he wants to use it again. He wants to check his urine because his stream has been a little slower then normal. No urgency or burning.    Review of Systems  Constitutional: Negative.   Genitourinary: Negative.        Objective:   Physical Exam  Constitutional: He appears well-developed and well-nourished.  Cardiovascular: Normal rate, regular rhythm, normal heart sounds and intact distal pulses.   Pulmonary/Chest: Effort normal and breath sounds normal.          Assessment & Plan:  His HTN is stable. Get back on Androgel pump. He may be a little dehydrated so I advised him to drink more water.

## 2012-10-10 NOTE — Addendum Note (Signed)
Addended by: Aniceto Boss A on: 10/10/2012 11:22 AM   Modules accepted: Orders

## 2012-10-13 ENCOUNTER — Emergency Department (HOSPITAL_COMMUNITY): Payer: Medicare Other

## 2012-10-13 ENCOUNTER — Encounter (HOSPITAL_COMMUNITY): Payer: Self-pay | Admitting: Emergency Medicine

## 2012-10-13 DIAGNOSIS — Z8619 Personal history of other infectious and parasitic diseases: Secondary | ICD-10-CM | POA: Diagnosis not present

## 2012-10-13 DIAGNOSIS — M129 Arthropathy, unspecified: Secondary | ICD-10-CM | POA: Insufficient documentation

## 2012-10-13 DIAGNOSIS — I10 Essential (primary) hypertension: Secondary | ICD-10-CM | POA: Diagnosis not present

## 2012-10-13 DIAGNOSIS — M109 Gout, unspecified: Secondary | ICD-10-CM | POA: Diagnosis not present

## 2012-10-13 DIAGNOSIS — F411 Generalized anxiety disorder: Secondary | ICD-10-CM | POA: Insufficient documentation

## 2012-10-13 DIAGNOSIS — Z87891 Personal history of nicotine dependence: Secondary | ICD-10-CM | POA: Insufficient documentation

## 2012-10-13 DIAGNOSIS — Z8659 Personal history of other mental and behavioral disorders: Secondary | ICD-10-CM | POA: Diagnosis not present

## 2012-10-13 DIAGNOSIS — F3289 Other specified depressive episodes: Secondary | ICD-10-CM | POA: Insufficient documentation

## 2012-10-13 DIAGNOSIS — M79609 Pain in unspecified limb: Secondary | ICD-10-CM | POA: Diagnosis not present

## 2012-10-13 DIAGNOSIS — Z79899 Other long term (current) drug therapy: Secondary | ICD-10-CM | POA: Diagnosis not present

## 2012-10-13 DIAGNOSIS — N529 Male erectile dysfunction, unspecified: Secondary | ICD-10-CM | POA: Diagnosis not present

## 2012-10-13 DIAGNOSIS — N401 Enlarged prostate with lower urinary tract symptoms: Secondary | ICD-10-CM | POA: Diagnosis not present

## 2012-10-13 DIAGNOSIS — N138 Other obstructive and reflux uropathy: Secondary | ICD-10-CM | POA: Insufficient documentation

## 2012-10-13 DIAGNOSIS — F329 Major depressive disorder, single episode, unspecified: Secondary | ICD-10-CM | POA: Diagnosis not present

## 2012-10-13 NOTE — ED Notes (Signed)
PT. REPORTS LEFT BIG TOE JOINT PAIN WITH SWELLING FOR 2 DAYS , DENIES INJURY.

## 2012-10-14 ENCOUNTER — Emergency Department (HOSPITAL_COMMUNITY)
Admission: EM | Admit: 2012-10-14 | Discharge: 2012-10-14 | Disposition: A | Discharge status: Home / Self Care | Payer: Medicare Other | Attending: Emergency Medicine | Admitting: Emergency Medicine

## 2012-10-14 DIAGNOSIS — M109 Gout, unspecified: Secondary | ICD-10-CM

## 2012-10-14 LAB — POCT I-STAT TROPONIN I: Troponin i, poc: 0.01 ng/mL (ref 0.00–0.08)

## 2012-10-14 LAB — POCT I-STAT, CHEM 8
Calcium, Ion: 1.22 mmol/L (ref 1.13–1.30)
Glucose, Bld: 134 mg/dL — ABNORMAL HIGH (ref 70–99)
HCT: 43 % (ref 39.0–52.0)
Hemoglobin: 14.6 g/dL (ref 13.0–17.0)
TCO2: 30 mmol/L (ref 0–100)

## 2012-10-14 MED ORDER — INDOMETHACIN 25 MG PO CAPS
50.0000 mg | ORAL_CAPSULE | Freq: Once | ORAL | Status: AC
  Administered 2012-10-14: 50 mg via ORAL
  Filled 2012-10-14: qty 2

## 2012-10-14 MED ORDER — INDOMETHACIN 25 MG PO CAPS: 50.0000 mg | ORAL_CAPSULE | Freq: Three times a day (TID) | ORAL | Status: DC

## 2012-10-14 MED ORDER — COLCHICINE 0.6 MG PO TABS
1.2000 mg | ORAL_TABLET | Freq: Once | ORAL | Status: DC
  Filled 2012-10-14: qty 2

## 2012-10-14 MED ORDER — HYDROCODONE-ACETAMINOPHEN 5-325 MG PO TABS: 1.0000 | ORAL_TABLET | ORAL | Status: DC | PRN

## 2012-10-14 NOTE — ED Provider Notes (Signed)
Medical screening examination/treatment/procedure(s) were performed by non-physician practitioner and as supervising physician I was immediately available for consultation/collaboration.  Lamark Schue M Nazar Kuan, MD 10/14/12 0727 

## 2012-10-14 NOTE — ED Provider Notes (Signed)
History     CSN: [id number]  Arrival date & time 10/13/12  2305   None     Chief Complaint  Patient presents with  . Toe Pain    (Consider location/radiation/quality/duration/timing/severity/associated sxs/prior treatment) HPI History provided by pt.   Pt has had non-traumatic pain in L great toe x 2 days.  Aggravated by bearing weight and even light palpation. No associated skin changes or fever.  No h/o gout.   Past Medical History  Diagnosis Date  . BPH with urinary obstruction   . ED (erectile dysfunction)   . Hypertension   . Arthritis   . PTSD (post-traumatic stress disorder)     sees Dr. Janit Pagan  . Chickenpox   . Depression     sees Dr. Raquel James and Judithe Modest  . Anxiety     sees Dr. Janit Pagan and Judithe Modest    Past Surgical History  Procedure Laterality Date  . Lumbar fusion    . Colonoscopy  10-10-07    per Dr. Arlyce Dice, clear, repeat in 10 yrs     Family History  Problem Relation Age of Onset  . Arthritis    . Coronary artery disease    . Diabetes    . Hyperlipidemia    . Hypertension    . Kidney disease      History  Substance Use Topics  . Smoking status: Former Games developer  . Smokeless tobacco: Never Used  . Alcohol Use: No      Review of Systems  All other systems reviewed and are negative.    Allergies  Abilify  Home Medications   Current Outpatient Rx  Name  Route  Sig  Dispense  Refill  . amLODipine (NORVASC) 5 MG tablet      TAKE 1 TABLET EVERY DAY   30 tablet   3   . cyclobenzaprine (FLEXERIL) 10 MG tablet   Oral   Take 1 tablet (10 mg total) by mouth 3 (three) times daily as needed for muscle spasms.   90 tablet   5   . gabapentin (NEURONTIN) 300 MG capsule               . hydrochlorothiazide (HYDRODIURIL) 25 MG tablet   Oral   Take 1 tablet (25 mg total) by mouth daily.   90 tablet   0     Pt requested a 90 day supply   . HYDROcodone-acetaminophen (NORCO) 10-325 MG per tablet   Oral   Take 1 tablet by  mouth every 6 (six) hours as needed for pain.   60 tablet   5   . losartan (COZAAR) 50 MG tablet   Oral   Take 1 tablet (50 mg total) by mouth daily.   90 tablet   3   . Tamsulosin HCl (FLOMAX) 0.4 MG CAPS   Oral   Take 2 capsules (0.8 mg total) by mouth 2 (two) times daily at 10 AM and 5 PM.   60 capsule   11   . Testosterone (ANDROGEL PUMP) 20.25 MG/ACT (1.62%) GEL   Transdermal   Place 4 Act onto the skin daily.   75 g   5     BP 139/79  Pulse 90  Temp(Src) 98.3 F (36.8 C) (Oral)  Resp 14  SpO2 99%  Physical Exam  Nursing note and vitals reviewed. Constitutional: He is oriented to person, place, and time. He appears well-developed and well-nourished. No distress.  HENT:  Head: Normocephalic and atraumatic.  Eyes:  Normal appearance  Neck: Normal range of motion.  Pulmonary/Chest: Effort normal.  Musculoskeletal: Normal range of motion.  Mild edema at L 1st MTP joint.  No erythema or other skin changes.  Severe ttp and pain/guarding w/ passive flexion.  2+ DP pulse and Distal sensation intact.   Neurological: He is alert and oriented to person, place, and time.  Psychiatric: He has a normal mood and affect. His behavior is normal.    ED Course  Procedures (including critical care time)  Labs Reviewed - No data to display Dg Toe Great Left  10/14/2012   *RADIOLOGY REPORT*  Clinical Data: Great toe pain  LEFT GREAT TOE  Comparison: None.  Findings: No fracture or dislocation.  No soft tissue abnormality. No radiopaque foreign body.  IMPRESSION: Normal exam.   Original Report Authenticated By: Christiana Pellant, M.D.     1. Gout       MDM  67yo M presents w/ non-traumatic L great toe pain.  Exam most consistent w/ gout, though warned patient to return immediately for fever, worsening pain, spreading erythema, ect.  Cr w/in nml range.  Received first dose of indomethacin in ED and d/c'd home w/ same + vicodin.  Recommended f/u w/ PCP. Return precautions  discussed.         Otilio Miu, PA-C 10/14/12 [phone]

## 2012-10-21 ENCOUNTER — Encounter: Payer: Self-pay | Admitting: Family Medicine

## 2012-10-21 ENCOUNTER — Ambulatory Visit (INDEPENDENT_AMBULATORY_CARE_PROVIDER_SITE_OTHER): Payer: Medicare Other | Admitting: Family Medicine

## 2012-10-21 VITALS — BP 148/82 | HR 96 | Temp 98.7°F | Wt 190.0 lb

## 2012-10-21 DIAGNOSIS — M109 Gout, unspecified: Secondary | ICD-10-CM

## 2012-10-21 LAB — URIC ACID: Uric Acid, Serum: 9.4 mg/dL — ABNORMAL HIGH (ref 4.0–7.8)

## 2012-10-21 MED ORDER — INDOMETHACIN 25 MG PO CAPS: 50.0000 mg | ORAL_CAPSULE | Freq: Three times a day (TID) | ORAL | Status: DC

## 2012-10-21 NOTE — Progress Notes (Signed)
  Subjective:    Patient ID: [name], male    DOB: [birth year], 67 y.o.   MRN: [id number]  HPI Here to follow up an ER visit on 10-14-12 for pain in the left great toe. This was presumed to be gout, and he was given Indomethacin and Vicodin. The toe feels better but is still tender. No recent trauma.    Review of Systems  Constitutional: Negative.   Musculoskeletal: Positive for joint swelling and arthralgias.       Objective:   Physical Exam  Constitutional: He appears well-developed and well-nourished.  Musculoskeletal:  The left great toe is tender over the MTP joint. Full ROM. Not warm or red           Assessment & Plan:  This is his first gout episode. Refilled indocin prn. Get a uric acid level.

## 2012-10-24 MED ORDER — ALLOPURINOL 300 MG PO TABS: 300.0000 mg | ORAL_TABLET | Freq: Every day | ORAL | Status: DC

## 2012-10-24 NOTE — Progress Notes (Signed)
Quick Note:  I sent script e-scribe, left voice message for pt and also released results in my chart. ______

## 2012-10-24 NOTE — Addendum Note (Signed)
Addended by: Aniceto Boss A on: 10/24/2012 03:53 PM   Modules accepted: Orders

## 2012-12-09 ENCOUNTER — Other Ambulatory Visit: Payer: Self-pay | Admitting: Family Medicine

## 2012-12-09 NOTE — Telephone Encounter (Signed)
Call in #60 with 5 rf 

## 2012-12-12 ENCOUNTER — Telehealth: Payer: Self-pay | Admitting: Family Medicine

## 2012-12-12 NOTE — Telephone Encounter (Signed)
Refill request for HCTZ 25 mg, can we refill this?

## 2012-12-12 NOTE — Telephone Encounter (Signed)
Refill for one year 

## 2012-12-15 MED ORDER — HYDROCHLOROTHIAZIDE 25 MG PO TABS: 25.0000 mg | ORAL_TABLET | Freq: Every day | ORAL | Status: DC

## 2012-12-15 NOTE — Telephone Encounter (Signed)
I sent script e-scribe. 

## 2012-12-17 ENCOUNTER — Other Ambulatory Visit: Payer: Self-pay

## 2013-01-01 ENCOUNTER — Ambulatory Visit (INDEPENDENT_AMBULATORY_CARE_PROVIDER_SITE_OTHER): Payer: Medicare Other | Admitting: Family Medicine

## 2013-01-01 ENCOUNTER — Encounter: Payer: Self-pay | Admitting: Family Medicine

## 2013-01-01 VITALS — BP 118/86 | Temp 98.0°F | Wt 189.0 lb

## 2013-01-01 DIAGNOSIS — R350 Frequency of micturition: Secondary | ICD-10-CM

## 2013-01-01 DIAGNOSIS — R81 Glycosuria: Secondary | ICD-10-CM | POA: Diagnosis not present

## 2013-01-01 DIAGNOSIS — R7309 Other abnormal glucose: Secondary | ICD-10-CM | POA: Diagnosis not present

## 2013-01-01 DIAGNOSIS — N401 Enlarged prostate with lower urinary tract symptoms: Secondary | ICD-10-CM | POA: Diagnosis not present

## 2013-01-01 DIAGNOSIS — R739 Hyperglycemia, unspecified: Secondary | ICD-10-CM

## 2013-01-01 LAB — POCT URINALYSIS DIPSTICK
Blood, UA: NEGATIVE
Ketones, UA: NEGATIVE
Protein, UA: NEGATIVE
Spec Grav, UA: 1.01
Urobilinogen, UA: 0.2
pH, UA: 5.5

## 2013-01-01 NOTE — Patient Instructions (Signed)
-  please call and schedule appointment with your urologist about worsening urinary symptoms  -We have ordered labs or studies at this visit. It can take up to 1-2 weeks for results and processing. We will contact you with instructions IF your results are abnormal. Normal results will be released to your [facility]. If you have not heard from [place] or can not find your results in [facility] in 2 weeks please contact our office. -if you have diabetes we will have you schedule an appointment with your doctor to discuss treatment  -We recommend the following healthy lifestyle measures: - eat a healthy diet consisting of lots of vegetables, fruits, beans, nuts, seeds, healthy meats such as white chicken and fish and whole grains.  - avoid fried foods, fast food, processed foods, sodas, red meet and other fattening foods.  - get a least 150 minutes of aerobic exercise per week.

## 2013-01-01 NOTE — Addendum Note (Signed)
Addended by: Bonnye Fava on: 01/01/2013 01:33 PM   Modules accepted: Orders

## 2013-01-01 NOTE — Progress Notes (Signed)
Chief Complaint  Patient presents with  . Cystitis    pt c/o having touble passing all of his urine x 1 week    HPI:  Acute visit for dysuria: -on ROC seen by PCP multiple times for LUT symptoms without evidence of infection on urine tests, has BPH and ED followed by urology -symptoms: a little hesitancy - has had this on and off in the past, urinary frequency - but seems to be getting worse -denies: fevers, chills, blood in urine, chills, abd pain, flank pain, malaise, vision changes -pt reports hx prediabetes  ROS: See pertinent positives and negatives per HPI.  Past Medical History  Diagnosis Date  . BPH with urinary obstruction   . ED (erectile dysfunction)   . Hypertension   . Arthritis   . PTSD (post-traumatic stress disorder)     sees Dr. Janit Pagan  . Chickenpox   . Depression     sees Dr. Raquel James and Judithe Modest  . Anxiety     sees Dr. Janit Pagan and Judithe Modest    Family History  Problem Relation Age of Onset  . Arthritis    . Coronary artery disease    . Diabetes    . Hyperlipidemia    . Hypertension    . Kidney disease      History   Social History  . Marital Status: Married    Spouse Name: N/A    Number of Children: N/A  . Years of Education: N/A   Social History Main Topics  . Smoking status: Former Games developer  . Smokeless tobacco: Never Used  . Alcohol Use: No  . Drug Use: No  . Sexual Activity: None   Other Topics Concern  . None   Social History Narrative  . None    Current outpatient prescriptions:allopurinol (ZYLOPRIM) 300 MG tablet, Take 1 tablet (300 mg total) by mouth daily., Disp: 30 tablet, Rfl: 11;  amLODipine (NORVASC) 5 MG tablet, TAKE 1 TABLET EVERY DAY, Disp: 30 tablet, Rfl: 3;  cyclobenzaprine (FLEXERIL) 10 MG tablet, Take 1 tablet (10 mg total) by mouth 3 (three) times daily as needed for muscle spasms., Disp: 90 tablet, Rfl: 5;  gabapentin (NEURONTIN) 300 MG capsule, , Disp: , Rfl:  hydrochlorothiazide (HYDRODIURIL) 25 MG  tablet, Take 1 tablet (25 mg total) by mouth daily., Disp: 90 tablet, Rfl: 3;  HYDROcodone-acetaminophen (NORCO) 10-325 MG per tablet, TAKE 1 TABLET BY MOUTH EVERY 6 HOURS AS NEEDED, Disp: 60 tablet, Rfl: 5;  indomethacin (INDOCIN) 25 MG capsule, Take 2 capsules (50 mg total) by mouth 3 (three) times daily with meals., Disp: 60 capsule, Rfl: 5 losartan (COZAAR) 50 MG tablet, Take 1 tablet (50 mg total) by mouth daily., Disp: 90 tablet, Rfl: 3;  tamsulosin (FLOMAX) 0.4 MG CAPS, Take 0.4 mg by mouth 2 (two) times daily at 10 AM and 5 PM., Disp: , Rfl: ;  Testosterone (ANDROGEL PUMP) 20.25 MG/ACT (1.62%) GEL, Place 4 Act onto the skin daily., Disp: 75 g, Rfl: 5  EXAM:  Filed Vitals:   01/01/13 1315  BP: 118/86  Temp: 98 F (36.7 C)    Body mass index is 31.22 kg/(m^2).  GENERAL: vitals reviewed and listed above, alert, oriented, appears well hydrated and in no acute distress  HEENT: atraumatic, conjunttiva clear, no obvious abnormalities on inspection of external nose and ears  NECK: no obvious masses on inspection  LUNGS: clear to auscultation bilaterally, no wheezes, rales or rhonchi, good air movement  CV: HRRR, no peripheral  edema  ABD: BS+, soft, NTTP, no CVA TTP  MS: moves all extremities without noticeable abnormality  PSYCH: pleasant and cooperative, no obvious depression or anxiety  ASSESSMENT AND PLAN:  Discussed the following assessment and plan:  Glucose found in urine on examination  Urinary frequency - Plan: Hemoglobin A1c  BENIGN PROSTATIC HYPERTROPHY, WITH OBSTRUCTION  Hyperglycemia - Plan: Hemoglobin A1c  -no signs uti on udip -hx BPH followed by urology -advised to see urologist -hx prediabetes, glu in urine, urine frequency --> hgba1c, pt will follow up with PCP if diabetic to discuss tx, advised importance of healthy lifestyle -Patient advised to return or notify a doctor immediately if symptoms worsen or persist or new concerns arise.  Patient  Instructions  -please call and schedule appointment with your urologist about worsening urinary symptoms  -We have ordered labs or studies at this visit. It can take up to 1-2 weeks for results and processing. We will contact you with instructions IF your results are abnormal. Normal results will be released to your [facility]. If you have not heard from [place] or can not find your results in [facility] in 2 weeks please contact our office. -if you have diabetes we will have you schedule an appointment with your doctor to discuss treatment  -We recommend the following healthy lifestyle measures: - eat a healthy diet consisting of lots of vegetables, fruits, beans, nuts, seeds, healthy meats such as white chicken and fish and whole grains.  - avoid fried foods, fast food, processed foods, sodas, red meet and other fattening foods.  - get a least 150 minutes of aerobic exercise per week.                  Stephen Basque R.

## 2013-01-02 NOTE — Progress Notes (Signed)
Quick Note:  Called and spoke with pt and pt is aware. ______ 

## 2013-01-08 ENCOUNTER — Ambulatory Visit (INDEPENDENT_AMBULATORY_CARE_PROVIDER_SITE_OTHER): Payer: Medicare Other | Admitting: Family Medicine

## 2013-01-08 ENCOUNTER — Encounter: Payer: Self-pay | Admitting: Family Medicine

## 2013-01-08 VITALS — BP 124/82 | HR 101 | Temp 97.9°F | Wt 184.0 lb

## 2013-01-08 DIAGNOSIS — N401 Enlarged prostate with lower urinary tract symptoms: Secondary | ICD-10-CM

## 2013-01-08 DIAGNOSIS — N139 Obstructive and reflux uropathy, unspecified: Secondary | ICD-10-CM

## 2013-01-08 DIAGNOSIS — M109 Gout, unspecified: Secondary | ICD-10-CM

## 2013-01-08 DIAGNOSIS — E119 Type 2 diabetes mellitus without complications: Secondary | ICD-10-CM

## 2013-01-08 DIAGNOSIS — N138 Other obstructive and reflux uropathy: Secondary | ICD-10-CM

## 2013-01-08 MED ORDER — METFORMIN HCL 500 MG PO TABS: 500.0000 mg | ORAL_TABLET | Freq: Two times a day (BID) | ORAL | Status: DC

## 2013-01-08 NOTE — Progress Notes (Signed)
  Subjective:    Patient ID: [name], male    DOB: [birth year], 67 y.o.   MRN: [id number]  HPI Here to follow up on newly diagnosed diabetes. He was here a week ago for some BPH symptoms, and his urine sample tested positive for glucose. His A1c turned out to be 9.7. He has had some hyperglycemia in the past but never to this point. Since then he has eliminated all sweets from his diet and has started exercising again. He feels well in general.    Review of Systems  Constitutional: Negative.   Respiratory: Negative.   Cardiovascular: Negative.   Neurological: Negative.        Objective:   Physical Exam  Constitutional: He appears well-developed and well-nourished.  Cardiovascular: Normal rate, regular rhythm, normal heart sounds and intact distal pulses.   Pulmonary/Chest: Effort normal and breath sounds normal.          Assessment & Plan:  Newly diagnosed type 2 diabetes. Start on Metformin 500 mg bid. He will return in 90 days for a cpx and fasting labs.

## 2013-01-14 DIAGNOSIS — N401 Enlarged prostate with lower urinary tract symptoms: Secondary | ICD-10-CM | POA: Diagnosis not present

## 2013-02-27 DIAGNOSIS — N401 Enlarged prostate with lower urinary tract symptoms: Secondary | ICD-10-CM | POA: Diagnosis not present

## 2013-03-03 ENCOUNTER — Ambulatory Visit (INDEPENDENT_AMBULATORY_CARE_PROVIDER_SITE_OTHER): Payer: Medicare Other | Admitting: Family Medicine

## 2013-03-03 ENCOUNTER — Encounter: Payer: Self-pay | Admitting: Family Medicine

## 2013-03-03 VITALS — BP 120/72 | HR 96 | Temp 98.1°F | Wt 177.0 lb

## 2013-03-03 DIAGNOSIS — N401 Enlarged prostate with lower urinary tract symptoms: Secondary | ICD-10-CM

## 2013-03-03 DIAGNOSIS — N139 Obstructive and reflux uropathy, unspecified: Secondary | ICD-10-CM

## 2013-03-03 DIAGNOSIS — E119 Type 2 diabetes mellitus without complications: Secondary | ICD-10-CM | POA: Diagnosis not present

## 2013-03-03 DIAGNOSIS — Z23 Encounter for immunization: Secondary | ICD-10-CM

## 2013-03-03 DIAGNOSIS — M109 Gout, unspecified: Secondary | ICD-10-CM

## 2013-03-03 DIAGNOSIS — I10 Essential (primary) hypertension: Secondary | ICD-10-CM | POA: Diagnosis not present

## 2013-03-03 DIAGNOSIS — IMO0001 Reserved for inherently not codable concepts without codable children: Secondary | ICD-10-CM

## 2013-03-03 DIAGNOSIS — N138 Other obstructive and reflux uropathy: Secondary | ICD-10-CM

## 2013-03-03 LAB — HEPATIC FUNCTION PANEL
Albumin: 4.2 g/dL (ref 3.5–5.2)
Alkaline Phosphatase: 64 U/L (ref 39–117)
Total Protein: 7.6 g/dL (ref 6.0–8.3)

## 2013-03-03 LAB — LIPID PANEL
Cholesterol: 196 mg/dL (ref 0–200)
HDL: 52.5 mg/dL (ref >39.00)
LDL Cholesterol: 118 mg/dL — ABNORMAL HIGH (ref 0–99)
Triglycerides: 128 mg/dL (ref 0.0–149.0)
VLDL: 25.6 mg/dL (ref 0.0–40.0)

## 2013-03-03 LAB — CBC WITH DIFFERENTIAL/PLATELET
Basophils Absolute: 0 10*3/uL (ref 0.0–0.1)
Basophils Relative: 0.5 % (ref 0.0–3.0)
Eosinophils Absolute: 0 10*3/uL (ref 0.0–0.7)
Lymphocytes Relative: 23.8 % (ref 12.0–46.0)
MCHC: 32.9 g/dL (ref 30.0–36.0)
Monocytes Absolute: 0.3 10*3/uL (ref 0.1–1.0)
Monocytes Relative: 6.1 % (ref 3.0–12.0)
Neutrophils Relative %: 68.8 % (ref 43.0–77.0)
Platelets: 163 10*3/uL (ref 150.0–400.0)
RBC: 4.76 Mil/uL (ref 4.22–5.81)
RDW: 16.6 % — ABNORMAL HIGH (ref 11.5–14.6)

## 2013-03-03 LAB — TSH: TSH: 0.47 u[IU]/mL (ref 0.35–5.50)

## 2013-03-03 LAB — URIC ACID: Uric Acid, Serum: 4.2 mg/dL (ref 4.0–7.8)

## 2013-03-03 LAB — BASIC METABOLIC PANEL
BUN: 17 mg/dL (ref 6–23)
Calcium: 9.9 mg/dL (ref 8.4–10.5)
Creatinine, Ser: 1.2 mg/dL (ref 0.4–1.5)
GFR: 80.1 mL/min (ref >60.00)
Glucose, Bld: 110 mg/dL — ABNORMAL HIGH (ref 70–99)

## 2013-03-03 LAB — PSA: PSA: 1.64 ng/mL (ref 0.10–4.00)

## 2013-03-03 NOTE — Progress Notes (Signed)
  Subjective:    Patient ID: [name], male    DOB: December 20, 1945, 67 y.o.   MRN: [id number]  HPI Here to recheck diabetes. He is taking Metformin and watching his diet. He feels great, he has more energy, and he says sex is working out much better for him. He has lost 12 lbs since August.   Review of Systems  Constitutional: Negative.   Cardiovascular: Negative.   Endocrine: Negative.   Neurological: Negative.        Objective:   Physical Exam  Constitutional: He appears well-developed and well-nourished.  Cardiovascular: Normal rate, regular rhythm, normal heart sounds and intact distal pulses.   Pulmonary/Chest: Effort normal and breath sounds normal.          Assessment & Plan:  He is doing well. Get fasting labs today

## 2013-03-03 NOTE — Addendum Note (Signed)
Addended by: Bonnye Fava on: 03/03/2013 09:04 AM   Modules accepted: Orders

## 2013-03-05 ENCOUNTER — Telehealth: Payer: Self-pay | Admitting: Family Medicine

## 2013-03-05 MED ORDER — HYDROCODONE-ACETAMINOPHEN 10-325 MG PO TABS: ORAL_TABLET | ORAL | Status: DC

## 2013-03-05 NOTE — Addendum Note (Signed)
Addended by: Aniceto Boss A on: 03/05/2013 09:02 AM   Modules accepted: Orders

## 2013-03-05 NOTE — Telephone Encounter (Signed)
Refill request for Norco and please call pt when ready.

## 2013-03-05 NOTE — Progress Notes (Signed)
Quick Note:  I spoke with pt and changed directions in chart for the Metformin, also released results in my chart. ______

## 2013-03-05 NOTE — Telephone Encounter (Signed)
done

## 2013-03-06 NOTE — Telephone Encounter (Signed)
Script is ready for pick up and I left a voice message for pt. 

## 2013-03-19 ENCOUNTER — Other Ambulatory Visit: Payer: Self-pay

## 2013-05-15 ENCOUNTER — Other Ambulatory Visit: Payer: Self-pay | Admitting: Family Medicine

## 2013-05-27 ENCOUNTER — Telehealth: Payer: Self-pay | Admitting: Family Medicine

## 2013-05-27 NOTE — Telephone Encounter (Signed)
Pt needs new rx hydrocodone °

## 2013-05-28 MED ORDER — HYDROCODONE-ACETAMINOPHEN 10-325 MG PO TABS: ORAL_TABLET | ORAL | Status: DC

## 2013-05-28 NOTE — Telephone Encounter (Signed)
Done for one month only. He needs testing and a contract

## 2013-05-28 NOTE — Telephone Encounter (Signed)
Script is ready for pick up, contract printed and I left a voice message.

## 2013-05-29 DIAGNOSIS — Z79899 Other long term (current) drug therapy: Secondary | ICD-10-CM | POA: Diagnosis not present

## 2013-06-01 ENCOUNTER — Ambulatory Visit (INDEPENDENT_AMBULATORY_CARE_PROVIDER_SITE_OTHER): Payer: Medicare Other | Admitting: Family Medicine

## 2013-06-01 ENCOUNTER — Encounter: Payer: Self-pay | Admitting: Family Medicine

## 2013-06-01 VITALS — BP 138/86 | HR 82 | Temp 98.9°F | Ht 65.25 in | Wt 177.0 lb

## 2013-06-01 DIAGNOSIS — I10 Essential (primary) hypertension: Secondary | ICD-10-CM | POA: Diagnosis not present

## 2013-06-01 DIAGNOSIS — M1990 Unspecified osteoarthritis, unspecified site: Secondary | ICD-10-CM

## 2013-06-01 DIAGNOSIS — E119 Type 2 diabetes mellitus without complications: Secondary | ICD-10-CM

## 2013-06-01 LAB — HEMOGLOBIN A1C: Hgb A1c MFr Bld: 6.4 % (ref 4.6–6.5)

## 2013-06-01 MED ORDER — METFORMIN HCL 1000 MG PO TABS: 1000.0000 mg | ORAL_TABLET | Freq: Two times a day (BID) | ORAL | Status: DC

## 2013-06-01 NOTE — Progress Notes (Signed)
   Subjective:    Patient ID: [name], male    DOB: [birth year], 68 y.o.   MRN: [id number]  HPI Here to follow up on diabetes. We have increased Metformin to 1000 mg bid and he has done well. No concerns today.    Review of Systems  Constitutional: Negative.   Respiratory: Negative.   Cardiovascular: Negative.        Objective:   Physical Exam  Constitutional: He appears well-developed and well-nourished.  Cardiovascular: Normal rate, regular rhythm, normal heart sounds and intact distal pulses.   Pulmonary/Chest: Effort normal and breath sounds normal.          Assessment & Plan:  Check an A1c today

## 2013-06-01 NOTE — Progress Notes (Signed)
Pre visit review using our clinic review tool, if applicable. No additional management support is needed unless otherwise documented below in the visit note. 

## 2013-06-02 ENCOUNTER — Telehealth: Payer: Self-pay | Admitting: Family Medicine

## 2013-06-02 ENCOUNTER — Telehealth: Payer: Self-pay

## 2013-06-02 NOTE — Telephone Encounter (Signed)
Relevant patient education assigned to patient using Emmi. ° °

## 2013-06-03 IMAGING — CR DG TOE GREAT 2+V*L*
3 series · 3 of 3 positions shown · non-contrast
Comparison: None.

CLINICAL DATA: Great toe pain

LEFT GREAT TOE

[x toes ap left]
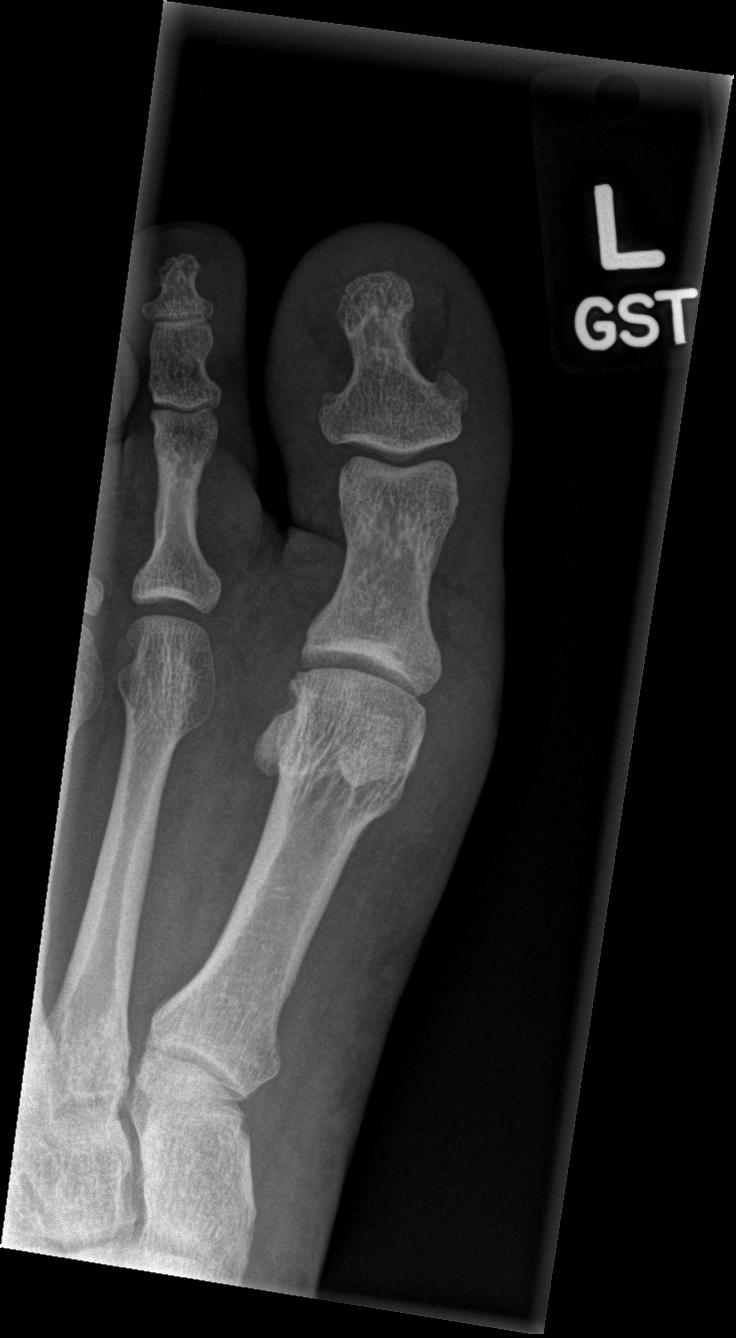

[x toes obl left]
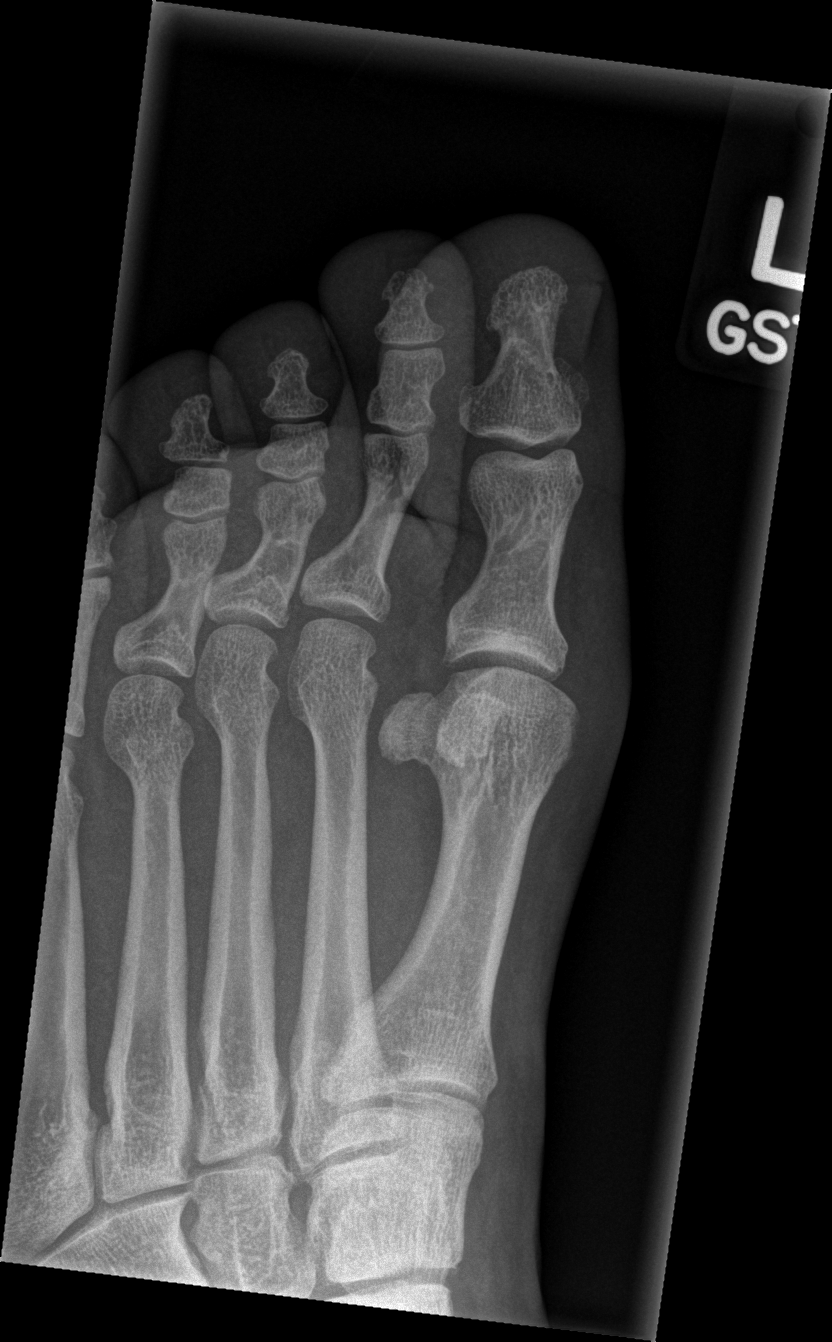

[x toes lat left]
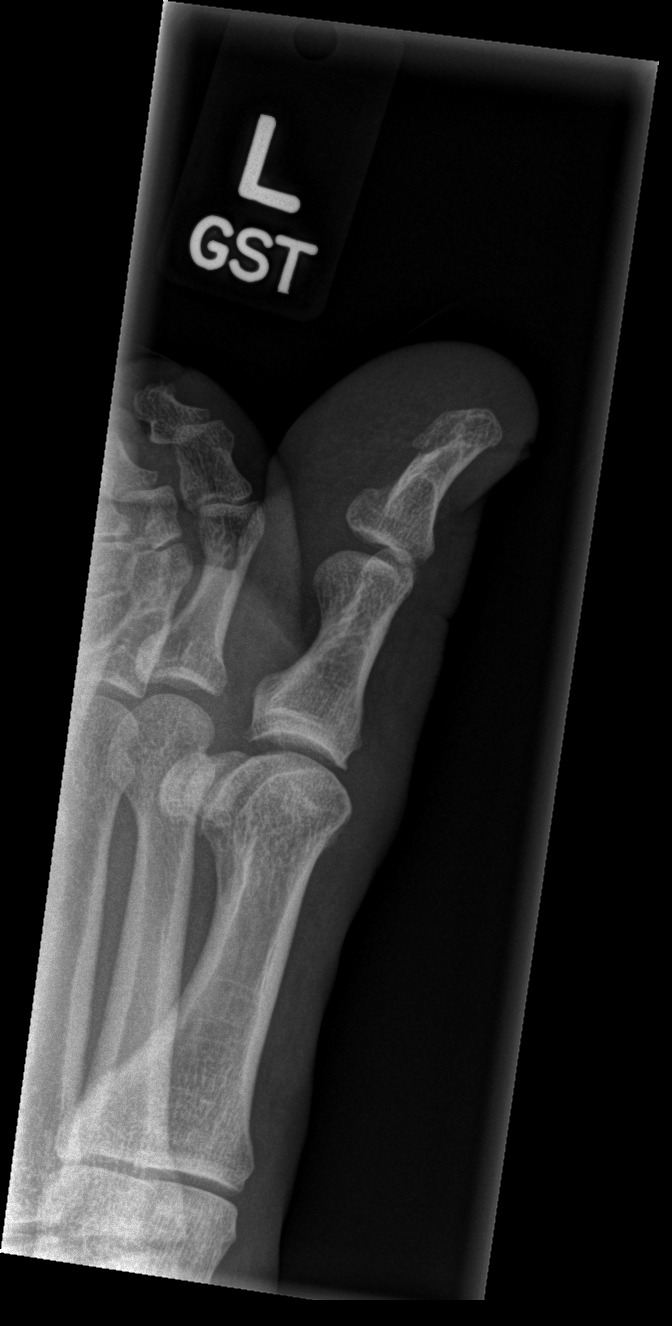

[3 of 3 positions shown; findings below may reference images not displayed]

FINDINGS: No fracture or dislocation.  No soft tissue abnormality.
No radiopaque foreign body.
IMPRESSION: Normal exam.

## 2013-06-26 ENCOUNTER — Telehealth: Payer: Self-pay | Admitting: Family Medicine

## 2013-06-26 MED ORDER — HYDROCODONE-ACETAMINOPHEN 10-325 MG PO TABS: ORAL_TABLET | ORAL | Status: DC

## 2013-06-26 NOTE — Telephone Encounter (Signed)
Done for 3 months

## 2013-06-26 NOTE — Telephone Encounter (Signed)
Pt needs new rx hydrocodone. Pt is almost out

## 2013-06-29 NOTE — Telephone Encounter (Signed)
Script is ready for pick up and I spoke with pt.  

## 2013-07-07 ENCOUNTER — Encounter: Payer: Self-pay | Admitting: Family Medicine

## 2013-07-07 ENCOUNTER — Ambulatory Visit (INDEPENDENT_AMBULATORY_CARE_PROVIDER_SITE_OTHER): Payer: Medicare Other | Admitting: Family Medicine

## 2013-07-07 VITALS — BP 126/80 | HR 99 | Temp 98.7°F | Ht 65.25 in | Wt 173.0 lb

## 2013-07-07 DIAGNOSIS — K5732 Diverticulitis of large intestine without perforation or abscess without bleeding: Secondary | ICD-10-CM | POA: Diagnosis not present

## 2013-07-07 MED ORDER — CIPROFLOXACIN HCL 500 MG PO TABS: 500.0000 mg | ORAL_TABLET | Freq: Two times a day (BID) | ORAL | Status: DC

## 2013-07-07 MED ORDER — METRONIDAZOLE 500 MG PO TABS: 500.0000 mg | ORAL_TABLET | Freq: Three times a day (TID) | ORAL | Status: DC

## 2013-07-07 NOTE — Progress Notes (Signed)
Pre visit review using our clinic review tool, if applicable. No additional management support is needed unless otherwise documented below in the visit note. 

## 2013-07-07 NOTE — Progress Notes (Signed)
   Subjective:    Patient ID: [name], male    DOB: 13-Jul-1945, 68 y.o.   MRN: [id number]  HPI Here for another apparent bout of diverticulitis. For 2 days he has had some LLQ discomfort, difficulty urinating, and nausea. No fever.    Review of Systems  Constitutional: Negative.   Gastrointestinal: Positive for nausea and abdominal pain. Negative for vomiting, diarrhea, constipation, blood in stool, abdominal distention and rectal pain.  Genitourinary: Positive for difficulty urinating. Negative for dysuria, urgency, frequency and flank pain.       Objective:   Physical Exam  Constitutional: He appears well-developed and well-nourished.  Abdominal: Soft. Bowel sounds are normal. He exhibits no distension and no mass. There is no tenderness. There is no rebound and no guarding.          Assessment & Plan:  Early diverticulitis. Recheck prn

## 2013-09-15 ENCOUNTER — Telehealth: Payer: Self-pay | Admitting: Family Medicine

## 2013-09-15 MED ORDER — HYDROCODONE-ACETAMINOPHEN 10-325 MG PO TABS: ORAL_TABLET | ORAL | Status: DC

## 2013-09-15 NOTE — Telephone Encounter (Signed)
Pt req rx on HYDROcodone-acetaminophen (NORCO) 10-325 MG per tablet ° °

## 2013-09-15 NOTE — Telephone Encounter (Signed)
done

## 2013-09-16 NOTE — Telephone Encounter (Signed)
Script is ready for pick up and I spoke with pt.  

## 2013-10-31 ENCOUNTER — Other Ambulatory Visit: Payer: Self-pay | Admitting: Family Medicine

## 2013-11-02 ENCOUNTER — Telehealth: Payer: Self-pay | Admitting: Family Medicine

## 2013-11-02 DIAGNOSIS — M25511 Pain in right shoulder: Secondary | ICD-10-CM

## 2013-11-02 NOTE — Telephone Encounter (Signed)
I spoke with pt and told him that I would forward this request and that Dr. Sarajane Jews would be returning next week.

## 2013-11-02 NOTE — Telephone Encounter (Signed)
Pt states that dr. Sarajane Jews had referred him to a shoulder specialist a while back, pt was dx with a rotator cuff bone spur, pt is needing a referral for that specialist again because pt states the pain has come back and they had already informed him that he would have to have surgery to correct the problem. Pt can not remember who the doctor was.

## 2013-11-04 DIAGNOSIS — M25519 Pain in unspecified shoulder: Secondary | ICD-10-CM | POA: Diagnosis not present

## 2013-11-05 ENCOUNTER — Other Ambulatory Visit: Payer: Self-pay | Admitting: Orthopedic Surgery

## 2013-11-05 DIAGNOSIS — M25511 Pain in right shoulder: Secondary | ICD-10-CM

## 2013-11-09 ENCOUNTER — Other Ambulatory Visit: Payer: Medicare Other

## 2013-11-09 ENCOUNTER — Ambulatory Visit
Admission: RE | Admit: 2013-11-09 | Discharge: 2013-11-09 | Disposition: A | Discharge status: Home / Self Care | Payer: Medicare Other | Source: Ambulatory Visit | Attending: Orthopedic Surgery | Admitting: Orthopedic Surgery

## 2013-11-09 DIAGNOSIS — M75100 Unspecified rotator cuff tear or rupture of unspecified shoulder, not specified as traumatic: Secondary | ICD-10-CM | POA: Diagnosis not present

## 2013-11-09 DIAGNOSIS — IMO0002 Reserved for concepts with insufficient information to code with codable children: Secondary | ICD-10-CM | POA: Diagnosis not present

## 2013-11-09 DIAGNOSIS — M25511 Pain in right shoulder: Secondary | ICD-10-CM

## 2013-11-09 DIAGNOSIS — M19019 Primary osteoarthritis, unspecified shoulder: Secondary | ICD-10-CM | POA: Diagnosis not present

## 2013-11-09 NOTE — Telephone Encounter (Signed)
Referral was done  

## 2013-11-09 NOTE — Addendum Note (Signed)
Addended by: Alysia Penna A on: 11/09/2013 05:39 AM   Modules accepted: Orders

## 2013-11-10 ENCOUNTER — Other Ambulatory Visit: Payer: Medicare Other

## 2013-11-10 DIAGNOSIS — M25519 Pain in unspecified shoulder: Secondary | ICD-10-CM | POA: Diagnosis not present

## 2013-11-10 NOTE — Telephone Encounter (Signed)
I spoke with pt  

## 2013-11-25 DIAGNOSIS — M25819 Other specified joint disorders, unspecified shoulder: Secondary | ICD-10-CM | POA: Diagnosis not present

## 2013-11-25 DIAGNOSIS — M19019 Primary osteoarthritis, unspecified shoulder: Secondary | ICD-10-CM | POA: Diagnosis not present

## 2013-11-25 DIAGNOSIS — G8918 Other acute postprocedural pain: Secondary | ICD-10-CM | POA: Diagnosis not present

## 2013-11-25 DIAGNOSIS — M24119 Other articular cartilage disorders, unspecified shoulder: Secondary | ICD-10-CM | POA: Diagnosis not present

## 2013-12-08 DIAGNOSIS — M25519 Pain in unspecified shoulder: Secondary | ICD-10-CM | POA: Diagnosis not present

## 2013-12-08 DIAGNOSIS — M25619 Stiffness of unspecified shoulder, not elsewhere classified: Secondary | ICD-10-CM | POA: Diagnosis not present

## 2013-12-08 DIAGNOSIS — Z9889 Other specified postprocedural states: Secondary | ICD-10-CM | POA: Diagnosis not present

## 2013-12-11 ENCOUNTER — Other Ambulatory Visit: Payer: Self-pay | Admitting: Family Medicine

## 2013-12-15 ENCOUNTER — Telehealth: Payer: Self-pay | Admitting: Family Medicine

## 2013-12-15 DIAGNOSIS — M25619 Stiffness of unspecified shoulder, not elsewhere classified: Secondary | ICD-10-CM | POA: Diagnosis not present

## 2013-12-15 DIAGNOSIS — M25519 Pain in unspecified shoulder: Secondary | ICD-10-CM | POA: Diagnosis not present

## 2013-12-15 DIAGNOSIS — Z9889 Other specified postprocedural states: Secondary | ICD-10-CM | POA: Diagnosis not present

## 2013-12-15 NOTE — Telephone Encounter (Signed)
Pt needs new rx hydrocodone °

## 2013-12-16 ENCOUNTER — Encounter: Payer: Self-pay | Admitting: Gastroenterology

## 2013-12-16 MED ORDER — HYDROCODONE-ACETAMINOPHEN 10-325 MG PO TABS: ORAL_TABLET | ORAL | Status: DC

## 2013-12-16 NOTE — Telephone Encounter (Signed)
done

## 2013-12-17 NOTE — Telephone Encounter (Signed)
Script is ready for pick up and I spoke with pt.  

## 2013-12-18 DIAGNOSIS — Z9889 Other specified postprocedural states: Secondary | ICD-10-CM | POA: Diagnosis not present

## 2013-12-18 DIAGNOSIS — M25519 Pain in unspecified shoulder: Secondary | ICD-10-CM | POA: Diagnosis not present

## 2013-12-18 DIAGNOSIS — M25619 Stiffness of unspecified shoulder, not elsewhere classified: Secondary | ICD-10-CM | POA: Diagnosis not present

## 2013-12-23 ENCOUNTER — Ambulatory Visit (INDEPENDENT_AMBULATORY_CARE_PROVIDER_SITE_OTHER): Payer: Medicare Other | Admitting: Family Medicine

## 2013-12-23 ENCOUNTER — Encounter: Payer: Self-pay | Admitting: Family Medicine

## 2013-12-23 VITALS — BP 136/74 | HR 91 | Temp 98.4°F | Ht 65.25 in | Wt 172.0 lb

## 2013-12-23 DIAGNOSIS — N419 Inflammatory disease of prostate, unspecified: Secondary | ICD-10-CM

## 2013-12-23 DIAGNOSIS — R350 Frequency of micturition: Secondary | ICD-10-CM

## 2013-12-23 LAB — POCT URINALYSIS DIPSTICK
Bilirubin, UA: NEGATIVE
Blood, UA: NEGATIVE
GLUCOSE UA: NEGATIVE
Ketones, UA: NEGATIVE
Nitrite, UA: NEGATIVE
Spec Grav, UA: 1.025
UROBILINOGEN UA: 0.2
pH, UA: 6

## 2013-12-23 MED ORDER — CIPROFLOXACIN HCL 500 MG PO TABS: 500.0000 mg | ORAL_TABLET | Freq: Two times a day (BID) | ORAL | Status: DC

## 2013-12-23 NOTE — Progress Notes (Signed)
   Subjective:    Patient ID: [name], male    DOB: July 07, 1945, 68 y.o.   MRN: [id number]  HPI Here for one week of urgency to urinate. No burning or DC. No fever. Drinking plenty of water.    Review of Systems  Constitutional: Negative.   Gastrointestinal: Negative.   Genitourinary: Positive for urgency and frequency. Negative for dysuria.       Objective:   Physical Exam  Constitutional: He appears well-developed and well-nourished.  Abdominal: Soft. Bowel sounds are normal. He exhibits no distension and no mass. There is no tenderness. There is no rebound and no guarding.          Assessment & Plan:  Treat with Cipro. Recheck prn

## 2013-12-23 NOTE — Progress Notes (Signed)
Pre visit review using our clinic review tool, if applicable. No additional management support is needed unless otherwise documented below in the visit note. 

## 2013-12-25 DIAGNOSIS — M25619 Stiffness of unspecified shoulder, not elsewhere classified: Secondary | ICD-10-CM | POA: Diagnosis not present

## 2013-12-25 DIAGNOSIS — M25519 Pain in unspecified shoulder: Secondary | ICD-10-CM | POA: Diagnosis not present

## 2013-12-25 DIAGNOSIS — Z9889 Other specified postprocedural states: Secondary | ICD-10-CM | POA: Diagnosis not present

## 2014-01-06 DIAGNOSIS — Z9889 Other specified postprocedural states: Secondary | ICD-10-CM | POA: Diagnosis not present

## 2014-01-06 DIAGNOSIS — M25619 Stiffness of unspecified shoulder, not elsewhere classified: Secondary | ICD-10-CM | POA: Diagnosis not present

## 2014-01-06 DIAGNOSIS — M25519 Pain in unspecified shoulder: Secondary | ICD-10-CM | POA: Diagnosis not present

## 2014-01-13 DIAGNOSIS — M25519 Pain in unspecified shoulder: Secondary | ICD-10-CM | POA: Diagnosis not present

## 2014-01-13 DIAGNOSIS — M25619 Stiffness of unspecified shoulder, not elsewhere classified: Secondary | ICD-10-CM | POA: Diagnosis not present

## 2014-01-13 DIAGNOSIS — Z9889 Other specified postprocedural states: Secondary | ICD-10-CM | POA: Diagnosis not present

## 2014-02-27 ENCOUNTER — Other Ambulatory Visit: Payer: Self-pay | Admitting: Family Medicine

## 2014-03-03 ENCOUNTER — Other Ambulatory Visit: Payer: Self-pay | Admitting: Family Medicine

## 2014-03-03 DIAGNOSIS — N401 Enlarged prostate with lower urinary tract symptoms: Secondary | ICD-10-CM | POA: Diagnosis not present

## 2014-03-03 DIAGNOSIS — R350 Frequency of micturition: Secondary | ICD-10-CM | POA: Diagnosis not present

## 2014-03-10 ENCOUNTER — Telehealth: Payer: Self-pay | Admitting: Family Medicine

## 2014-03-10 MED ORDER — HYDROCODONE-ACETAMINOPHEN 10-325 MG PO TABS: ORAL_TABLET | ORAL | Status: DC

## 2014-03-10 NOTE — Telephone Encounter (Signed)
done

## 2014-03-10 NOTE — Telephone Encounter (Signed)
Pt request refill of the following: HYDROcodone-acetaminophen (NORCO) 10-325 MG per tablet ° ° ° °Phamacy:   Pick up  ° °

## 2014-03-12 NOTE — Telephone Encounter (Signed)
Script is ready for pick up and I spoke with pt.  

## 2014-05-03 ENCOUNTER — Ambulatory Visit (INDEPENDENT_AMBULATORY_CARE_PROVIDER_SITE_OTHER): Payer: Medicare Other | Admitting: Family Medicine

## 2014-05-03 DIAGNOSIS — Z23 Encounter for immunization: Secondary | ICD-10-CM | POA: Diagnosis not present

## 2014-05-19 ENCOUNTER — Other Ambulatory Visit: Payer: Self-pay

## 2014-05-19 MED ORDER — LOSARTAN POTASSIUM 50 MG PO TABS: 50.0000 mg | ORAL_TABLET | Freq: Every day | ORAL | Status: DC

## 2014-05-19 NOTE — Telephone Encounter (Signed)
Rx request for Losartan potassium 50 mg tablet- Take 1 tablet by mouth daily #90  Pharm:  [facility].   Rx sent to pharmacy for 90 day supply- pt needs office visit for future refills.

## 2014-05-28 ENCOUNTER — Encounter: Payer: Self-pay | Admitting: Family Medicine

## 2014-05-28 ENCOUNTER — Ambulatory Visit (INDEPENDENT_AMBULATORY_CARE_PROVIDER_SITE_OTHER): Payer: Medicare Other | Admitting: Family Medicine

## 2014-05-28 VITALS — BP 138/76 | HR 77 | Temp 98.5°F | Ht 65.25 in | Wt 177.0 lb

## 2014-05-28 DIAGNOSIS — N138 Other obstructive and reflux uropathy: Secondary | ICD-10-CM

## 2014-05-28 DIAGNOSIS — N401 Enlarged prostate with lower urinary tract symptoms: Secondary | ICD-10-CM

## 2014-05-28 MED ORDER — FINASTERIDE 5 MG PO TABS: 5.0000 mg | ORAL_TABLET | Freq: Every day | ORAL | Status: DC

## 2014-05-28 NOTE — Progress Notes (Signed)
Pre visit review using our clinic review tool, if applicable. No additional management support is needed unless otherwise documented below in the visit note. 

## 2014-05-28 NOTE — Progress Notes (Signed)
   Subjective:    Patient ID: [name], male    DOB: [birth year], 69 y.o.   MRN: [id number]  HPI Here for gradually worsening frequency of urination and a feeling of incomplete bladder emptying. No pain or fever or urgency. He has been on Flomax 0.4 mg daily for several years. He cannot tolerate the 0.8 mg dose due to lightheadedness. His PSA with [place] has always been between 1 and 2, and he reportedly had a normal PSA last October at his visit with Dr. Junious Silk.    Review of Systems  Constitutional: Negative.   Genitourinary: Positive for frequency and difficulty urinating. Negative for urgency, hematuria and flank pain.       Objective:   Physical Exam  Constitutional: He appears well-developed and well-nourished.  Genitourinary:  The prostate is moderately enlarged but not nodular or tender.           Assessment & Plan:  This is BPH. Stay on Flomax but add Proscar 5 mg daily. Recheck in 90 days

## 2014-05-31 ENCOUNTER — Telehealth: Payer: Self-pay | Admitting: Family Medicine

## 2014-05-31 MED ORDER — HYDROCODONE-ACETAMINOPHEN 10-325 MG PO TABS: ORAL_TABLET | ORAL | Status: DC

## 2014-05-31 NOTE — Telephone Encounter (Signed)
Pt needs new rx hydrocodone °

## 2014-05-31 NOTE — Telephone Encounter (Signed)
done

## 2014-05-31 NOTE — Telephone Encounter (Signed)
Script is ready for pick up, tried to reach pt by phone and no answer. 

## 2014-06-06 ENCOUNTER — Other Ambulatory Visit: Payer: Self-pay | Admitting: Family Medicine

## 2014-07-23 ENCOUNTER — Other Ambulatory Visit: Payer: Self-pay | Admitting: *Deleted

## 2014-07-23 MED ORDER — ALLOPURINOL 300 MG PO TABS: 300.0000 mg | ORAL_TABLET | Freq: Every day | ORAL | Status: DC

## 2014-07-23 MED ORDER — FINASTERIDE 5 MG PO TABS: 5.0000 mg | ORAL_TABLET | Freq: Every day | ORAL | Status: DC

## 2014-08-12 ENCOUNTER — Other Ambulatory Visit: Payer: Self-pay | Admitting: Family Medicine

## 2014-08-24 ENCOUNTER — Telehealth: Payer: Self-pay | Admitting: Family Medicine

## 2014-08-24 NOTE — Telephone Encounter (Signed)
Patient requesting re-fill on HYDROcodone-acetaminophen (NORCO) 10-325 MG per tablet.

## 2014-08-25 MED ORDER — HYDROCODONE-ACETAMINOPHEN 10-325 MG PO TABS: ORAL_TABLET | ORAL | Status: DC

## 2014-08-25 NOTE — Telephone Encounter (Signed)
done

## 2014-08-25 NOTE — Telephone Encounter (Signed)
Script is ready for pick up and I spoke with pt.  

## 2014-10-20 ENCOUNTER — Other Ambulatory Visit: Payer: Self-pay | Admitting: Family Medicine

## 2014-11-08 ENCOUNTER — Other Ambulatory Visit: Payer: Self-pay

## 2014-11-13 ENCOUNTER — Other Ambulatory Visit: Payer: Self-pay | Admitting: Family Medicine

## 2014-11-16 ENCOUNTER — Telehealth: Payer: Self-pay | Admitting: Family Medicine

## 2014-11-16 MED ORDER — HYDROCODONE-ACETAMINOPHEN 10-325 MG PO TABS: ORAL_TABLET | ORAL | Status: DC

## 2014-11-16 NOTE — Telephone Encounter (Signed)
Pt needs new rx hydrocodone °

## 2014-11-16 NOTE — Telephone Encounter (Signed)
done

## 2014-11-17 NOTE — Telephone Encounter (Signed)
Script is ready for pick up and I left a voice message.  

## 2014-12-03 ENCOUNTER — Other Ambulatory Visit: Payer: Self-pay | Admitting: Family Medicine

## 2014-12-16 ENCOUNTER — Ambulatory Visit (INDEPENDENT_AMBULATORY_CARE_PROVIDER_SITE_OTHER): Payer: Medicare Other | Admitting: Family Medicine

## 2014-12-16 ENCOUNTER — Encounter: Payer: Self-pay | Admitting: Family Medicine

## 2014-12-16 VITALS — BP 136/79 | HR 71 | Temp 98.5°F | Ht 65.25 in | Wt 178.0 lb

## 2014-12-16 DIAGNOSIS — Z7721 Contact with and (suspected) exposure to potentially hazardous body fluids: Secondary | ICD-10-CM

## 2014-12-16 DIAGNOSIS — F4310 Post-traumatic stress disorder, unspecified: Secondary | ICD-10-CM

## 2014-12-16 DIAGNOSIS — M109 Gout, unspecified: Secondary | ICD-10-CM | POA: Insufficient documentation

## 2014-12-16 DIAGNOSIS — I10 Essential (primary) hypertension: Secondary | ICD-10-CM | POA: Diagnosis not present

## 2014-12-16 DIAGNOSIS — M5440 Lumbago with sciatica, unspecified side: Secondary | ICD-10-CM | POA: Diagnosis not present

## 2014-12-16 DIAGNOSIS — N401 Enlarged prostate with lower urinary tract symptoms: Secondary | ICD-10-CM | POA: Diagnosis not present

## 2014-12-16 DIAGNOSIS — E119 Type 2 diabetes mellitus without complications: Secondary | ICD-10-CM

## 2014-12-16 DIAGNOSIS — N138 Other obstructive and reflux uropathy: Secondary | ICD-10-CM

## 2014-12-16 DIAGNOSIS — M1A9XX0 Chronic gout, unspecified, without tophus (tophi): Secondary | ICD-10-CM | POA: Diagnosis not present

## 2014-12-16 LAB — HEPATIC FUNCTION PANEL
ALT: 22 U/L (ref 0–53)
AST: 24 U/L (ref 0–37)
Albumin: 4.3 g/dL (ref 3.5–5.2)
Alkaline Phosphatase: 65 U/L (ref 39–117)
Bilirubin, Direct: 0.1 mg/dL (ref 0.0–0.3)
TOTAL PROTEIN: 7.7 g/dL (ref 6.0–8.3)
Total Bilirubin: 0.6 mg/dL (ref 0.2–1.2)

## 2014-12-16 LAB — LIPID PANEL
CHOL/HDL RATIO: 3
CHOLESTEROL: 200 mg/dL (ref 0–200)
HDL: 59.2 mg/dL (ref >39.00)
LDL Cholesterol: 114 mg/dL — ABNORMAL HIGH (ref 0–99)
NonHDL: 140.45
TRIGLYCERIDES: 134 mg/dL (ref 0.0–149.0)
VLDL: 26.8 mg/dL (ref 0.0–40.0)

## 2014-12-16 LAB — CBC WITH DIFFERENTIAL/PLATELET
Basophils Absolute: 0 10*3/uL (ref 0.0–0.1)
Basophils Relative: 0.4 % (ref 0.0–3.0)
EOS ABS: 0 10*3/uL (ref 0.0–0.7)
Eosinophils Relative: 0.5 % (ref 0.0–5.0)
HCT: 43.2 % (ref 39.0–52.0)
Hemoglobin: 14.2 g/dL (ref 13.0–17.0)
LYMPHS ABS: 1.2 10*3/uL (ref 0.7–4.0)
Lymphocytes Relative: 19.2 % (ref 12.0–46.0)
MCHC: 32.9 g/dL (ref 30.0–36.0)
MCV: 83.6 fl (ref 78.0–100.0)
MONO ABS: 0.4 10*3/uL (ref 0.1–1.0)
MONOS PCT: 5.9 % (ref 3.0–12.0)
NEUTROS ABS: 4.8 10*3/uL (ref 1.4–7.7)
Neutrophils Relative %: 74 % (ref 43.0–77.0)
Platelets: 171 10*3/uL (ref 150.0–400.0)
RBC: 5.16 Mil/uL (ref 4.22–5.81)
RDW: 15.3 % (ref 11.5–15.5)
WBC: 6.4 10*3/uL (ref 4.0–10.5)

## 2014-12-16 LAB — POCT URINALYSIS DIPSTICK
Bilirubin, UA: NEGATIVE
Glucose, UA: NEGATIVE
Ketones, UA: NEGATIVE
Leukocytes, UA: NEGATIVE
Nitrite, UA: NEGATIVE
PH UA: 6.5
Protein, UA: NEGATIVE
RBC UA: NEGATIVE
SPEC GRAV UA: 1.02
UROBILINOGEN UA: 0.2

## 2014-12-16 LAB — TSH: TSH: 0.52 u[IU]/mL (ref 0.35–4.50)

## 2014-12-16 LAB — HEMOGLOBIN A1C: Hgb A1c MFr Bld: 6.1 % (ref 4.6–6.5)

## 2014-12-16 LAB — BASIC METABOLIC PANEL
BUN: 17 mg/dL (ref 6–23)
CALCIUM: 10.1 mg/dL (ref 8.4–10.5)
CHLORIDE: 103 meq/L (ref 96–112)
CO2: 29 mEq/L (ref 19–32)
CREATININE: 1.11 mg/dL (ref 0.40–1.50)
GFR: 84.66 mL/min (ref >60.00)
Glucose, Bld: 103 mg/dL — ABNORMAL HIGH (ref 70–99)
POTASSIUM: 4.6 meq/L (ref 3.5–5.1)
Sodium: 139 mEq/L (ref 135–145)

## 2014-12-16 LAB — URIC ACID: Uric Acid, Serum: 3.5 mg/dL — ABNORMAL LOW (ref 4.0–7.8)

## 2014-12-16 LAB — PSA: PSA: 0.95 ng/mL (ref 0.10–4.00)

## 2014-12-16 MED ORDER — FINASTERIDE 5 MG PO TABS
5.0000 mg | ORAL_TABLET | Freq: Every day | ORAL | Status: DC
Start: 2014-12-16 — End: 2015-12-30

## 2014-12-16 MED ORDER — HYDROCHLOROTHIAZIDE 25 MG PO TABS: 25.0000 mg | ORAL_TABLET | Freq: Every day | ORAL | Status: DC

## 2014-12-16 MED ORDER — TAMSULOSIN HCL 0.4 MG PO CAPS: 0.4000 mg | ORAL_CAPSULE | Freq: Two times a day (BID) | ORAL | Status: DC

## 2014-12-16 MED ORDER — LOSARTAN POTASSIUM 50 MG PO TABS: 50.0000 mg | ORAL_TABLET | Freq: Every day | ORAL | Status: DC

## 2014-12-16 MED ORDER — METFORMIN HCL 1000 MG PO TABS: ORAL_TABLET | ORAL | Status: DC

## 2014-12-16 MED ORDER — ALLOPURINOL 300 MG PO TABS: 300.0000 mg | ORAL_TABLET | Freq: Every day | ORAL | Status: DC

## 2014-12-16 NOTE — Progress Notes (Signed)
   Subjective:    Patient ID: [name], male    DOB: 12/05/1945, 69 y.o.   MRN: [id number]  HPI 69 yr old male for a follow up. He feels well. He asks to check a hepatitis C test since he received blood transfusions in the past. His BP has been stable. His PTSD has been stable and he notes that he no longer sees a therapist.    Review of Systems  Constitutional: Negative.   HENT: Negative.   Eyes: Negative.   Respiratory: Negative.   Cardiovascular: Negative.   Gastrointestinal: Negative.   Genitourinary: Negative.   Musculoskeletal: Negative.   Skin: Negative.   Neurological: Negative.   Psychiatric/Behavioral: Negative.        Objective:   Physical Exam  Constitutional: He is oriented to person, place, and time. He appears well-developed and well-nourished. No distress.  HENT:  Head: Normocephalic and atraumatic.  Right Ear: External ear normal.  Left Ear: External ear normal.  Nose: Nose normal.  Mouth/Throat: Oropharynx is clear and moist. No oropharyngeal exudate.  Eyes: Conjunctivae and EOM are normal. Pupils are equal, round, and reactive to light. Right eye exhibits no discharge. Left eye exhibits no discharge. No scleral icterus.  Neck: Neck supple. No JVD present. No tracheal deviation present. No thyromegaly present.  Cardiovascular: Normal rate, regular rhythm, normal heart sounds and intact distal pulses.  Exam reveals no gallop and no friction rub.   No murmur heard. EKG normal   Pulmonary/Chest: Effort normal and breath sounds normal. No respiratory distress. He has no wheezes. He has no rales. He exhibits no tenderness.  Abdominal: Soft. Bowel sounds are normal. He exhibits no distension and no mass. There is no tenderness. There is no rebound and no guarding.  Genitourinary: Rectum normal, prostate normal and penis normal. Guaiac negative stool. No penile tenderness.  Musculoskeletal: Normal range of motion. He exhibits no edema or tenderness.    Lymphadenopathy:    He has no cervical adenopathy.  Neurological: He is alert and oriented to person, place, and time. He has normal reflexes. No cranial nerve deficit. He exhibits normal muscle tone. Coordination normal.  Skin: Skin is warm and dry. No rash noted. He is not diaphoretic. No erythema. No pallor.  Psychiatric: He has a normal mood and affect. His behavior is normal. Judgment and thought content normal.          Assessment & Plan:  His HTN is stable. We will get an A1c with other labs to check his diabetes. We discussed diet and exercise advice. His gout is stable. Check a Hep C titer.

## 2014-12-16 NOTE — Progress Notes (Signed)
Pre visit review using our clinic review tool, if applicable. No additional management support is needed unless otherwise documented below in the visit note. 

## 2014-12-17 LAB — HEPATITIS C RNA QUANTITATIVE
HCV QUANT LOG: 6.54 {Log} — AB (ref <1.18)
HCV QUANT: 3503259 [IU]/mL — AB (ref <15)

## 2014-12-17 LAB — HEPATITIS C ANTIBODY: HCV AB: REACTIVE — AB

## 2015-01-05 ENCOUNTER — Encounter: Payer: Self-pay | Admitting: Family Medicine

## 2015-01-05 ENCOUNTER — Ambulatory Visit (INDEPENDENT_AMBULATORY_CARE_PROVIDER_SITE_OTHER): Payer: Medicare Other | Admitting: Family Medicine

## 2015-01-05 VITALS — BP 130/73 | HR 73 | Temp 98.6°F | Ht 65.25 in | Wt 180.0 lb

## 2015-01-05 DIAGNOSIS — B1920 Unspecified viral hepatitis C without hepatic coma: Secondary | ICD-10-CM | POA: Diagnosis not present

## 2015-01-05 DIAGNOSIS — Z23 Encounter for immunization: Secondary | ICD-10-CM

## 2015-01-05 DIAGNOSIS — B199 Unspecified viral hepatitis without hepatic coma: Secondary | ICD-10-CM | POA: Insufficient documentation

## 2015-01-05 MED ORDER — LORAZEPAM 1 MG PO TABS: 1.0000 mg | ORAL_TABLET | Freq: Four times a day (QID) | ORAL | Status: DC | PRN

## 2015-01-05 NOTE — Progress Notes (Signed)
Pre visit review using our clinic review tool, if applicable. No additional management support is needed unless otherwise documented below in the visit note. 

## 2015-01-05 NOTE — Progress Notes (Signed)
   Subjective:    Patient ID: [name], male    DOB: 1945-06-12, 69 y.o.   MRN: [id number]  HPI Here asking for advice about his recent diagnosis with hepatitis C. We checked this as a part of general screening recently. He has no symptoms and his liver enzymes are all normal.    Review of Systems  Constitutional: Negative.   Respiratory: Negative.   Cardiovascular: Negative.   Gastrointestinal: Negative.   Neurological: Negative.        Objective:   Physical Exam  Constitutional: He appears well-developed and well-nourished.  Eyes: No scleral icterus.  Cardiovascular: Normal rate, regular rhythm, normal heart sounds and intact distal pulses.   Pulmonary/Chest: Effort normal and breath sounds normal.  Abdominal: Soft. Bowel sounds are normal. He exhibits no distension and no mass. There is no tenderness. There is no rebound and no guarding.          Assessment & Plan:  Recent diagnosis of hepatitis C. We discussed ways to avoid irritating is liver suc as limiting is use of acetominaphen. He does not use alcohol. He is aware that there are treatments available now, and we will refer him to the hepatitis C clinic.

## 2015-01-18 ENCOUNTER — Other Ambulatory Visit (HOSPITAL_COMMUNITY): Payer: Self-pay | Admitting: Nurse Practitioner

## 2015-01-18 DIAGNOSIS — B182 Chronic viral hepatitis C: Secondary | ICD-10-CM

## 2015-02-09 ENCOUNTER — Telehealth: Payer: Self-pay | Admitting: Family Medicine

## 2015-02-09 MED ORDER — HYDROCODONE-ACETAMINOPHEN 10-325 MG PO TABS: ORAL_TABLET | ORAL | Status: DC

## 2015-02-09 NOTE — Telephone Encounter (Signed)
done

## 2015-02-09 NOTE — Telephone Encounter (Signed)
Pt needs new rx hydrocodone °

## 2015-02-10 NOTE — Telephone Encounter (Signed)
Script is ready for pick up and I left a voice message for pt. 

## 2015-02-15 ENCOUNTER — Ambulatory Visit (HOSPITAL_COMMUNITY)
Admission: RE | Admit: 2015-02-15 | Discharge: 2015-02-15 | Disposition: A | Discharge status: Home / Self Care | Payer: Medicare Other | Source: Ambulatory Visit | Attending: Nurse Practitioner | Admitting: Nurse Practitioner

## 2015-02-15 DIAGNOSIS — B1920 Unspecified viral hepatitis C without hepatic coma: Secondary | ICD-10-CM | POA: Insufficient documentation

## 2015-02-15 DIAGNOSIS — B182 Chronic viral hepatitis C: Secondary | ICD-10-CM

## 2015-03-31 DIAGNOSIS — B182 Chronic viral hepatitis C: Secondary | ICD-10-CM | POA: Diagnosis not present

## 2015-04-28 ENCOUNTER — Telehealth: Payer: Self-pay | Admitting: Family Medicine

## 2015-04-28 NOTE — Telephone Encounter (Signed)
° ° °  Pt request refill of the following: ° ° °HYDROcodone-acetaminophen (NORCO) 10-325 MG tablet ° ° °Phamacy: °

## 2015-04-29 NOTE — Telephone Encounter (Signed)
NO, he is 2 weeks too early

## 2015-04-29 NOTE — Telephone Encounter (Signed)
I spoke with pt  

## 2015-05-10 ENCOUNTER — Other Ambulatory Visit: Payer: Self-pay | Admitting: Family Medicine

## 2015-05-10 NOTE — Telephone Encounter (Signed)
Hydrocodone last refilled 02/09/15 for #60 with 0 refills. Last office visit was 01/05/15. Ok to refill this medication?

## 2015-05-10 NOTE — Telephone Encounter (Signed)
Pt would like a refill on his hydrocodone.

## 2015-05-11 MED ORDER — HYDROCODONE-ACETAMINOPHEN 10-325 MG PO TABS: ORAL_TABLET | ORAL | Status: DC

## 2015-05-11 NOTE — Telephone Encounter (Signed)
Patient notified that Rx is ready for pick up.

## 2015-05-11 NOTE — Telephone Encounter (Signed)
done

## 2015-05-15 HISTORY — PX: SHOULDER SURGERY: SHX246

## 2015-08-03 DIAGNOSIS — B182 Chronic viral hepatitis C: Secondary | ICD-10-CM | POA: Diagnosis not present

## 2015-08-05 ENCOUNTER — Telehealth: Payer: Self-pay | Admitting: Family Medicine

## 2015-08-05 MED ORDER — HYDROCODONE-ACETAMINOPHEN 10-325 MG PO TABS: ORAL_TABLET | ORAL | Status: DC

## 2015-08-05 NOTE — Telephone Encounter (Signed)
done

## 2015-08-05 NOTE — Telephone Encounter (Signed)
Pt needs new rx hydrocodone °

## 2015-08-05 NOTE — Telephone Encounter (Signed)
I called the pt and informed him the Rxs were left at the front desk for him to pick up. 

## 2015-11-02 ENCOUNTER — Telehealth: Payer: Self-pay | Admitting: Family Medicine

## 2015-11-02 MED ORDER — HYDROCODONE-ACETAMINOPHEN 10-325 MG PO TABS: ORAL_TABLET | ORAL | Status: DC

## 2015-11-02 NOTE — Telephone Encounter (Signed)
done

## 2015-11-02 NOTE — Telephone Encounter (Signed)
Pt need new Rx for hydrocodone °

## 2015-11-02 NOTE — Telephone Encounter (Signed)
Script is ready for pick up and I spoke with pt.  

## 2015-12-01 ENCOUNTER — Other Ambulatory Visit: Payer: Self-pay | Admitting: Family Medicine

## 2015-12-01 NOTE — Telephone Encounter (Signed)
Refill sent to pharmacy.   

## 2015-12-30 ENCOUNTER — Other Ambulatory Visit: Payer: Self-pay | Admitting: Family Medicine

## 2016-01-11 ENCOUNTER — Other Ambulatory Visit: Payer: Self-pay | Admitting: Family Medicine

## 2016-01-29 ENCOUNTER — Other Ambulatory Visit: Payer: Self-pay | Admitting: Family Medicine

## 2016-01-30 ENCOUNTER — Telehealth: Payer: Self-pay | Admitting: Family Medicine

## 2016-01-30 MED ORDER — HYDROCODONE-ACETAMINOPHEN 10-325 MG PO TABS: ORAL_TABLET | ORAL | 0 refills | Status: DC

## 2016-01-30 NOTE — Telephone Encounter (Signed)
Pt request refill  °HYDROcodone-acetaminophen (NORCO) 10-325 MG tablet °

## 2016-01-30 NOTE — Telephone Encounter (Signed)
done

## 2016-02-01 DIAGNOSIS — B182 Chronic viral hepatitis C: Secondary | ICD-10-CM | POA: Diagnosis not present

## 2016-02-01 DIAGNOSIS — K740 Hepatic fibrosis: Secondary | ICD-10-CM | POA: Diagnosis not present

## 2016-02-02 ENCOUNTER — Other Ambulatory Visit: Payer: Self-pay | Admitting: Nurse Practitioner

## 2016-02-02 DIAGNOSIS — K7400 Hepatic fibrosis, unspecified: Secondary | ICD-10-CM

## 2016-02-11 ENCOUNTER — Other Ambulatory Visit: Payer: Self-pay | Admitting: Family Medicine

## 2016-02-14 ENCOUNTER — Ambulatory Visit
Admission: RE | Admit: 2016-02-14 | Discharge: 2016-02-14 | Disposition: A | Discharge status: Home / Self Care | Payer: Medicare Other | Source: Ambulatory Visit | Attending: Nurse Practitioner | Admitting: Nurse Practitioner

## 2016-02-14 DIAGNOSIS — K7400 Hepatic fibrosis, unspecified: Secondary | ICD-10-CM

## 2016-02-14 DIAGNOSIS — R935 Abnormal findings on diagnostic imaging of other abdominal regions, including retroperitoneum: Secondary | ICD-10-CM | POA: Diagnosis not present

## 2016-02-26 ENCOUNTER — Other Ambulatory Visit: Payer: Self-pay | Admitting: Family Medicine

## 2016-02-27 NOTE — Telephone Encounter (Signed)
Rx refused as pt has not had OV or labs in >102yr.

## 2016-04-03 ENCOUNTER — Other Ambulatory Visit: Payer: Self-pay | Admitting: Family Medicine

## 2016-04-09 ENCOUNTER — Other Ambulatory Visit: Payer: Self-pay | Admitting: Family Medicine

## 2016-04-11 ENCOUNTER — Ambulatory Visit (INDEPENDENT_AMBULATORY_CARE_PROVIDER_SITE_OTHER): Payer: Medicare Other | Admitting: Family Medicine

## 2016-04-11 ENCOUNTER — Encounter: Payer: Self-pay | Admitting: Family Medicine

## 2016-04-11 VITALS — BP 140/90 | HR 79 | Temp 98.2°F | Ht 65.25 in | Wt 186.1 lb

## 2016-04-11 DIAGNOSIS — E119 Type 2 diabetes mellitus without complications: Secondary | ICD-10-CM | POA: Diagnosis not present

## 2016-04-11 DIAGNOSIS — F411 Generalized anxiety disorder: Secondary | ICD-10-CM | POA: Diagnosis not present

## 2016-04-11 DIAGNOSIS — I10 Essential (primary) hypertension: Secondary | ICD-10-CM

## 2016-04-11 DIAGNOSIS — Z23 Encounter for immunization: Secondary | ICD-10-CM | POA: Diagnosis not present

## 2016-04-11 MED ORDER — METFORMIN HCL 1000 MG PO TABS: ORAL_TABLET | ORAL | 3 refills | Status: DC

## 2016-04-11 MED ORDER — HYDROCHLOROTHIAZIDE 25 MG PO TABS: 25.0000 mg | ORAL_TABLET | Freq: Every day | ORAL | 3 refills | Status: DC

## 2016-04-11 MED ORDER — LOSARTAN POTASSIUM 50 MG PO TABS: 50.0000 mg | ORAL_TABLET | Freq: Every day | ORAL | 3 refills | Status: DC

## 2016-04-11 MED ORDER — HYDROCODONE-ACETAMINOPHEN 10-325 MG PO TABS: ORAL_TABLET | ORAL | 0 refills | Status: DC

## 2016-04-11 MED ORDER — LORAZEPAM 1 MG PO TABS: 1.0000 mg | ORAL_TABLET | Freq: Four times a day (QID) | ORAL | 1 refills | Status: DC | PRN

## 2016-04-11 MED ORDER — FINASTERIDE 5 MG PO TABS: 5.0000 mg | ORAL_TABLET | Freq: Every day | ORAL | 3 refills | Status: DC

## 2016-04-11 MED ORDER — ALLOPURINOL 300 MG PO TABS: 300.0000 mg | ORAL_TABLET | Freq: Every day | ORAL | 3 refills | Status: DC

## 2016-04-11 NOTE — Progress Notes (Signed)
Pre visit review using our clinic review tool, if applicable. No additional management support is needed unless otherwise documented below in the visit note. 

## 2016-04-11 NOTE — Progress Notes (Signed)
   Subjective:    Patient ID: [name], male    DOB: [birth year], 70 y.o.   MRN: [id number]  HPI Here for med refills. He has been doing well, he still bowls and plays golf. He had a cpx scheduled after the first of next year but his meds will run out before then. He does not check his BP or his glucoses.    Review of Systems  Constitutional: Negative.   Respiratory: Negative.   Cardiovascular: Negative.   Neurological: Negative.        Objective:   Physical Exam  Constitutional: He is oriented to person, place, and time. He appears well-developed and well-nourished.  Neck: No thyromegaly present.  Cardiovascular: Normal rate, regular rhythm, normal heart sounds and intact distal pulses.   Pulmonary/Chest: Effort normal and breath sounds normal.  Lymphadenopathy:    He has no cervical adenopathy.  Neurological: He is alert and oriented to person, place, and time.          Assessment & Plan:  His HTN is stable. Anxiety is stable. We will check an A1c today. Laurey Morale, MD

## 2016-04-12 LAB — HEMOGLOBIN A1C: Hgb A1c MFr Bld: 6.3 % (ref 4.6–6.5)

## 2016-05-03 ENCOUNTER — Telehealth: Payer: Self-pay | Admitting: Family Medicine

## 2016-05-03 NOTE — Telephone Encounter (Signed)
Pt request refill  °HYDROcodone-acetaminophen (NORCO) 10-325 MG tablet °

## 2016-05-04 MED ORDER — HYDROCODONE-ACETAMINOPHEN 10-325 MG PO TABS: ORAL_TABLET | ORAL | 0 refills | Status: DC

## 2016-05-04 NOTE — Telephone Encounter (Signed)
Scripts are ready for pick up here at front office and I spoke with pt. 

## 2016-05-04 NOTE — Telephone Encounter (Signed)
done

## 2016-05-11 ENCOUNTER — Other Ambulatory Visit (INDEPENDENT_AMBULATORY_CARE_PROVIDER_SITE_OTHER): Payer: Medicare Other

## 2016-05-11 DIAGNOSIS — N138 Other obstructive and reflux uropathy: Secondary | ICD-10-CM

## 2016-05-11 DIAGNOSIS — Z125 Encounter for screening for malignant neoplasm of prostate: Secondary | ICD-10-CM

## 2016-05-11 DIAGNOSIS — N401 Enlarged prostate with lower urinary tract symptoms: Secondary | ICD-10-CM

## 2016-05-11 DIAGNOSIS — I10 Essential (primary) hypertension: Secondary | ICD-10-CM | POA: Diagnosis not present

## 2016-05-11 DIAGNOSIS — E119 Type 2 diabetes mellitus without complications: Secondary | ICD-10-CM

## 2016-05-11 DIAGNOSIS — E785 Hyperlipidemia, unspecified: Secondary | ICD-10-CM

## 2016-05-11 LAB — POC URINALSYSI DIPSTICK (AUTOMATED)
BILIRUBIN UA: NEGATIVE
Glucose, UA: NEGATIVE
Ketones, UA: NEGATIVE
Leukocytes, UA: NEGATIVE
NITRITE UA: NEGATIVE
Protein, UA: NEGATIVE
RBC UA: NEGATIVE
Spec Grav, UA: 1.015
Urobilinogen, UA: 0.2
pH, UA: 6.5

## 2016-05-11 LAB — CBC WITH DIFFERENTIAL/PLATELET
BASOS ABS: 0 10*3/uL (ref 0.0–0.1)
Basophils Relative: 0.4 % (ref 0.0–3.0)
Eosinophils Absolute: 0.1 10*3/uL (ref 0.0–0.7)
Eosinophils Relative: 1 % (ref 0.0–5.0)
HCT: 43.7 % (ref 39.0–52.0)
Hemoglobin: 14.7 g/dL (ref 13.0–17.0)
LYMPHS ABS: 1.5 10*3/uL (ref 0.7–4.0)
Lymphocytes Relative: 25.5 % (ref 12.0–46.0)
MCHC: 33.5 g/dL (ref 30.0–36.0)
MCV: 83.7 fl (ref 78.0–100.0)
MONO ABS: 0.3 10*3/uL (ref 0.1–1.0)
Monocytes Relative: 4.9 % (ref 3.0–12.0)
NEUTROS PCT: 68.2 % (ref 43.0–77.0)
Neutro Abs: 4 10*3/uL (ref 1.4–7.7)
Platelets: 157 10*3/uL (ref 150.0–400.0)
RBC: 5.22 Mil/uL (ref 4.22–5.81)
RDW: 15.6 % — ABNORMAL HIGH (ref 11.5–15.5)
WBC: 5.9 10*3/uL (ref 4.0–10.5)

## 2016-05-11 LAB — BASIC METABOLIC PANEL
BUN: 14 mg/dL (ref 6–23)
CALCIUM: 9.8 mg/dL (ref 8.4–10.5)
CO2: 31 mEq/L (ref 19–32)
Chloride: 102 mEq/L (ref 96–112)
Creatinine, Ser: 1.09 mg/dL (ref 0.40–1.50)
GFR: 86.1 mL/min (ref >60.00)
Glucose, Bld: 117 mg/dL — ABNORMAL HIGH (ref 70–99)
POTASSIUM: 4.2 meq/L (ref 3.5–5.1)
SODIUM: 139 meq/L (ref 135–145)

## 2016-05-11 LAB — LIPID PANEL
Cholesterol: 201 mg/dL — ABNORMAL HIGH (ref 0–200)
HDL: 50 mg/dL (ref >39.00)
LDL CALC: 120 mg/dL — AB (ref 0–99)
NONHDL: 150.83
Total CHOL/HDL Ratio: 4
Triglycerides: 156 mg/dL — ABNORMAL HIGH (ref 0.0–149.0)
VLDL: 31.2 mg/dL (ref 0.0–40.0)

## 2016-05-11 LAB — HEPATIC FUNCTION PANEL
ALK PHOS: 61 U/L (ref 39–117)
ALT: 32 U/L (ref 0–53)
AST: 32 U/L (ref 0–37)
Albumin: 4.4 g/dL (ref 3.5–5.2)
BILIRUBIN TOTAL: 0.6 mg/dL (ref 0.2–1.2)
Bilirubin, Direct: 0.1 mg/dL (ref 0.0–0.3)
Total Protein: 7.6 g/dL (ref 6.0–8.3)

## 2016-05-11 LAB — MICROALBUMIN / CREATININE URINE RATIO
Creatinine,U: 143.4 mg/dL
MICROALB UR: 2.1 mg/dL — AB (ref 0.0–1.9)
MICROALB/CREAT RATIO: 1.5 mg/g (ref 0.0–30.0)

## 2016-05-11 LAB — HEMOGLOBIN A1C: Hgb A1c MFr Bld: 6.4 % (ref 4.6–6.5)

## 2016-05-11 LAB — TSH: TSH: 0.84 u[IU]/mL (ref 0.35–4.50)

## 2016-05-11 LAB — PSA: PSA: 0.53 ng/mL (ref 0.10–4.00)

## 2016-05-15 ENCOUNTER — Ambulatory Visit (INDEPENDENT_AMBULATORY_CARE_PROVIDER_SITE_OTHER): Payer: Medicare Other | Admitting: Family Medicine

## 2016-05-15 ENCOUNTER — Encounter: Payer: Self-pay | Admitting: Family Medicine

## 2016-05-15 VITALS — BP 146/82 | HR 82 | Temp 98.7°F | Ht 65.25 in | Wt 188.0 lb

## 2016-05-15 DIAGNOSIS — M150 Primary generalized (osteo)arthritis: Secondary | ICD-10-CM | POA: Diagnosis not present

## 2016-05-15 DIAGNOSIS — F411 Generalized anxiety disorder: Secondary | ICD-10-CM | POA: Diagnosis not present

## 2016-05-15 DIAGNOSIS — I10 Essential (primary) hypertension: Secondary | ICD-10-CM

## 2016-05-15 DIAGNOSIS — E119 Type 2 diabetes mellitus without complications: Secondary | ICD-10-CM | POA: Diagnosis not present

## 2016-05-15 DIAGNOSIS — M159 Polyosteoarthritis, unspecified: Secondary | ICD-10-CM

## 2016-05-15 NOTE — Progress Notes (Signed)
   Subjective:    Patient ID: [name], male    DOB: [birth year], 71 y.o.   MRN: [id number]  HPI 70 yr old male to follow up on issues including diabetes, HTN, and arthritis. He is doing well, and he has no complaints. He still sees the [place] [facility] for his PTSD.   Review of Systems  Constitutional: Negative.   HENT: Negative.   Eyes: Negative.   Respiratory: Negative.   Cardiovascular: Negative.   Gastrointestinal: Negative.   Genitourinary: Negative.   Musculoskeletal: Negative.   Skin: Negative.   Neurological: Negative.   Psychiatric/Behavioral: Negative.        Objective:   Physical Exam  Constitutional: He is oriented to person, place, and time. He appears well-developed and well-nourished. No distress.  HENT:  Head: Normocephalic and atraumatic.  Right Ear: External ear normal.  Left Ear: External ear normal.  Nose: Nose normal.  Mouth/Throat: Oropharynx is clear and moist. No oropharyngeal exudate.  Eyes: Conjunctivae and EOM are normal. Pupils are equal, round, and reactive to light. Right eye exhibits no discharge. Left eye exhibits no discharge. No scleral icterus.  Neck: Neck supple. No JVD present. No tracheal deviation present. No thyromegaly present.  Cardiovascular: Normal rate, regular rhythm, normal heart sounds and intact distal pulses.  Exam reveals no gallop and no friction rub.   No murmur heard. Pulmonary/Chest: Effort normal and breath sounds normal. No respiratory distress. He has no wheezes. He has no rales. He exhibits no tenderness.  Abdominal: Soft. Bowel sounds are normal. He exhibits no distension and no mass. There is no tenderness. There is no rebound and no guarding.  Genitourinary: Rectum normal, prostate normal and penis normal. Rectal exam shows guaiac negative stool. No penile tenderness.  Musculoskeletal: Normal range of motion. He exhibits no edema or tenderness.  Lymphadenopathy:    He has no cervical adenopathy.  Neurological: He is  alert and oriented to person, place, and time. He has normal reflexes. No cranial nerve deficit. He exhibits normal muscle tone. Coordination normal.  Skin: Skin is warm and dry. No rash noted. He is not diaphoretic. No erythema. No pallor.  Psychiatric: He has a normal mood and affect. His behavior is normal. Judgment and thought content normal.          Assessment & Plan:  His HTN is well controlled. His diabetes is well controlled. His depression and PTSD seem to be well controlled. His arthritis pains are well controlled. He is exercising and looking forward to planting his garden again this spring.  Alysia Penna, MD

## 2016-05-15 NOTE — Progress Notes (Signed)
Pre visit review using our clinic review tool, if applicable. No additional management support is needed unless otherwise documented below in the visit note. 

## 2016-05-25 DIAGNOSIS — E119 Type 2 diabetes mellitus without complications: Secondary | ICD-10-CM | POA: Diagnosis not present

## 2016-05-25 LAB — HM DIABETES EYE EXAM

## 2016-06-08 ENCOUNTER — Encounter: Payer: Self-pay | Admitting: Family Medicine

## 2016-07-18 DIAGNOSIS — K740 Hepatic fibrosis: Secondary | ICD-10-CM | POA: Diagnosis not present

## 2016-07-20 ENCOUNTER — Other Ambulatory Visit (HOSPITAL_COMMUNITY): Payer: Self-pay | Admitting: Nurse Practitioner

## 2016-07-20 DIAGNOSIS — B1920 Unspecified viral hepatitis C without hepatic coma: Secondary | ICD-10-CM

## 2016-08-03 ENCOUNTER — Telehealth: Payer: Self-pay | Admitting: Family Medicine

## 2016-08-03 NOTE — Telephone Encounter (Signed)
Pt needs new rx hydrocodone °

## 2016-08-06 MED ORDER — HYDROCODONE-ACETAMINOPHEN 10-325 MG PO TABS: ORAL_TABLET | ORAL | 0 refills | Status: DC

## 2016-08-06 NOTE — Telephone Encounter (Signed)
done

## 2016-08-06 NOTE — Telephone Encounter (Signed)
Script is ready for pick up here at front office and I spoke with pt.  

## 2016-08-21 ENCOUNTER — Ambulatory Visit (HOSPITAL_COMMUNITY)
Admission: RE | Admit: 2016-08-21 | Discharge: 2016-08-21 | Disposition: A | Discharge status: Home / Self Care | Payer: Medicare Other | Source: Ambulatory Visit | Attending: Nurse Practitioner | Admitting: Nurse Practitioner

## 2016-08-21 DIAGNOSIS — B182 Chronic viral hepatitis C: Secondary | ICD-10-CM | POA: Insufficient documentation

## 2016-08-21 DIAGNOSIS — B1920 Unspecified viral hepatitis C without hepatic coma: Secondary | ICD-10-CM

## 2016-09-04 ENCOUNTER — Telehealth: Payer: Self-pay | Admitting: Family Medicine

## 2016-09-04 MED ORDER — HYDROCODONE-ACETAMINOPHEN 10-325 MG PO TABS: ORAL_TABLET | ORAL | 0 refills | Status: DC

## 2016-09-04 NOTE — Telephone Encounter (Signed)
done

## 2016-09-04 NOTE — Telephone Encounter (Signed)
° ° °  Pt request refill of the following: ° ° °HYDROcodone-acetaminophen (NORCO) 10-325 MG tablet ° ° °Phamacy: °

## 2016-09-04 NOTE — Telephone Encounter (Signed)
Script is ready for pick up here at front office and I spoke with pt.  

## 2016-09-18 ENCOUNTER — Ambulatory Visit (INDEPENDENT_AMBULATORY_CARE_PROVIDER_SITE_OTHER): Payer: Medicare Other | Admitting: Family Medicine

## 2016-09-18 ENCOUNTER — Encounter: Payer: Self-pay | Admitting: Family Medicine

## 2016-09-18 VITALS — BP 150/90 | HR 75 | Temp 98.6°F | Ht 65.25 in | Wt 186.0 lb

## 2016-09-18 DIAGNOSIS — M79644 Pain in right finger(s): Secondary | ICD-10-CM | POA: Diagnosis not present

## 2016-09-18 MED ORDER — TAMSULOSIN HCL 0.4 MG PO CAPS: ORAL_CAPSULE | ORAL | 3 refills | Status: DC

## 2016-09-18 MED ORDER — DICLOFENAC SODIUM 75 MG PO TBEC: 75.0000 mg | DELAYED_RELEASE_TABLET | Freq: Two times a day (BID) | ORAL | 2 refills | Status: DC

## 2016-09-18 NOTE — Progress Notes (Signed)
   Subjective:    Patient ID: [name], male    DOB: 1945/10/23, 71 y.o.   MRN: [id number]  HPI Here for 2 weeks of swelling and pain in the right third finger. No recent trauma but he did fracture this finger 40 years ago. The recent pain started after he worked in his garden. Bowling and playing golf makes it worse. He has taken nothing other than his usual Norco for this.    Review of Systems  Constitutional: Negative.   Musculoskeletal: Positive for arthralgias and joint swelling.       Objective:   Physical Exam  Constitutional: He appears well-developed and well-nourished.  Musculoskeletal:  The right 3rd MCP joint is swollen and tender, ROM is limited. No erythema or warmth. No crepitus.          Assessment & Plan:  He has some arthritis in this joint. It is hard to say if this is gout or osteoarthritis. Treat with Diclofenac bid for a week. Add Norco prn. Recheck if no improvement.  Alysia Penna, MD

## 2016-09-18 NOTE — Patient Instructions (Signed)
WE NOW OFFER   [facility] FAST TRACK!!!  SAME DAY Appointments for ACUTE CARE  Such as: Sprains, Injuries, cuts, abrasions, rashes, muscle pain, joint pain, back pain Colds, flu, sore throats, headache, allergies, cough, fever  Ear pain, sinus and eye infections Abdominal pain, nausea, vomiting, diarrhea, upset stomach Animal/insect bites  3 Easy Ways to Schedule: Walk-In Scheduling Call in scheduling Mychart Sign-up: [email or web address]         

## 2016-10-09 ENCOUNTER — Telehealth: Payer: Self-pay | Admitting: Family Medicine

## 2016-10-09 MED ORDER — HYDROCODONE-ACETAMINOPHEN 10-325 MG PO TABS
ORAL_TABLET | ORAL | 0 refills | Status: DC
Start: 2016-10-09 — End: 2016-11-08

## 2016-10-09 NOTE — Telephone Encounter (Signed)
Pt need new Rx for Hydrocodone   Pt is aware of 3 business days for refills and someone will call when ready for pick up. °

## 2016-10-09 NOTE — Telephone Encounter (Signed)
Script is ready for pick up here at front office and I spoke with pt.  

## 2016-10-09 NOTE — Telephone Encounter (Signed)
done

## 2016-10-30 ENCOUNTER — Other Ambulatory Visit: Payer: Self-pay | Admitting: Family Medicine

## 2016-10-30 MED ORDER — DICLOFENAC SODIUM 75 MG PO TBEC: 75.0000 mg | DELAYED_RELEASE_TABLET | Freq: Two times a day (BID) | ORAL | 1 refills | Status: DC

## 2016-10-30 NOTE — Telephone Encounter (Signed)
Received a fax from the pharmacy.  Pt is requesting a 90 day supply.  Please re send.

## 2016-11-07 ENCOUNTER — Telehealth: Payer: Self-pay | Admitting: Family Medicine

## 2016-11-07 NOTE — Telephone Encounter (Signed)
Pt request refill  °HYDROcodone-acetaminophen (NORCO) 10-325 MG tablet °

## 2016-11-08 ENCOUNTER — Other Ambulatory Visit: Payer: Self-pay | Admitting: Emergency Medicine

## 2016-11-08 MED ORDER — HYDROCODONE-ACETAMINOPHEN 10-325 MG PO TABS: ORAL_TABLET | ORAL | 0 refills | Status: DC

## 2016-11-08 NOTE — Telephone Encounter (Signed)
Pt is aware that he needs to make an appointment for further refills. In the mean time Dr. Regis Bill said she will fill prescription and dispense 24 tabs

## 2016-11-08 NOTE — Telephone Encounter (Signed)
Dr Clarene Duke out of office  Can refill disp 30#  Or wait for dr Sarajane Jews to return     further refills  Per dr Sarajane Jews

## 2016-11-08 NOTE — Telephone Encounter (Signed)
Can you refill medication until dr fry returns?

## 2016-11-12 MED ORDER — HYDROCODONE-ACETAMINOPHEN 10-325 MG PO TABS: 1.0000 | ORAL_TABLET | Freq: Three times a day (TID) | ORAL | 0 refills | Status: DC | PRN

## 2016-11-19 DIAGNOSIS — H9113 Presbycusis, bilateral: Secondary | ICD-10-CM | POA: Diagnosis not present

## 2016-11-19 DIAGNOSIS — H903 Sensorineural hearing loss, bilateral: Secondary | ICD-10-CM | POA: Diagnosis not present

## 2016-12-10 ENCOUNTER — Telehealth: Payer: Self-pay | Admitting: Family Medicine

## 2016-12-10 MED ORDER — HYDROCODONE-ACETAMINOPHEN 10-325 MG PO TABS: 1.0000 | ORAL_TABLET | Freq: Three times a day (TID) | ORAL | 0 refills | Status: DC | PRN

## 2016-12-10 NOTE — Telephone Encounter (Signed)
Pt request refill  °HYDROcodone-acetaminophen (NORCO) 10-325 MG tablet °

## 2016-12-10 NOTE — Telephone Encounter (Signed)
done

## 2016-12-10 NOTE — Telephone Encounter (Signed)
Script is ready for pick up here at front office and I left a message.  

## 2016-12-15 ENCOUNTER — Other Ambulatory Visit: Payer: Self-pay | Admitting: Family Medicine

## 2016-12-17 NOTE — Telephone Encounter (Signed)
Call in #60 with 2 rf 

## 2016-12-18 NOTE — Telephone Encounter (Signed)
Rx done. 

## 2017-01-11 ENCOUNTER — Telehealth: Payer: Self-pay | Admitting: Family Medicine

## 2017-01-11 MED ORDER — HYDROCODONE-ACETAMINOPHEN 10-325 MG PO TABS: 1.0000 | ORAL_TABLET | Freq: Three times a day (TID) | ORAL | 0 refills | Status: DC | PRN

## 2017-01-11 NOTE — Telephone Encounter (Signed)
done

## 2017-01-11 NOTE — Telephone Encounter (Signed)
Script is ready for pick up here at front office and I left a message.  

## 2017-01-11 NOTE — Telephone Encounter (Signed)
Pt request refill  °HYDROcodone-acetaminophen (NORCO) 10-325 MG tablet °

## 2017-01-31 ENCOUNTER — Encounter: Payer: Self-pay | Admitting: Family Medicine

## 2017-02-11 ENCOUNTER — Telehealth: Payer: Self-pay | Admitting: Family Medicine

## 2017-02-11 MED ORDER — HYDROCODONE-ACETAMINOPHEN 10-325 MG PO TABS: 1.0000 | ORAL_TABLET | Freq: Three times a day (TID) | ORAL | 0 refills | Status: DC | PRN

## 2017-02-11 NOTE — Telephone Encounter (Signed)
done

## 2017-02-11 NOTE — Telephone Encounter (Signed)
Patient needs the following medication refilled.  HYDROcodone-acetaminophen (NORCO) 10-325 MG tablet

## 2017-02-12 NOTE — Telephone Encounter (Signed)
Script is ready for pick up here at front office and I spoke with pt, letter was given.

## 2017-03-13 ENCOUNTER — Telehealth: Payer: Self-pay | Admitting: Family Medicine

## 2017-03-13 MED ORDER — HYDROCODONE-ACETAMINOPHEN 10-325 MG PO TABS: 1.0000 | ORAL_TABLET | Freq: Three times a day (TID) | ORAL | 0 refills | Status: DC | PRN

## 2017-03-13 NOTE — Telephone Encounter (Signed)
Pt need new Rx for hydrocodone   Pt is aware of 3 business days for refills and someone will call when ready for pick up. °

## 2017-03-13 NOTE — Telephone Encounter (Signed)
Done

## 2017-03-15 NOTE — Telephone Encounter (Signed)
Per Sunday Spillers the Rx is up front.

## 2017-04-03 ENCOUNTER — Encounter: Payer: Self-pay | Admitting: Family Medicine

## 2017-04-03 ENCOUNTER — Ambulatory Visit (INDEPENDENT_AMBULATORY_CARE_PROVIDER_SITE_OTHER): Payer: Medicare Other | Admitting: Family Medicine

## 2017-04-03 VITALS — BP 140/90 | HR 85 | Temp 98.4°F | Wt 183.8 lb

## 2017-04-03 DIAGNOSIS — Z23 Encounter for immunization: Secondary | ICD-10-CM | POA: Diagnosis not present

## 2017-04-03 DIAGNOSIS — E119 Type 2 diabetes mellitus without complications: Secondary | ICD-10-CM | POA: Diagnosis not present

## 2017-04-03 DIAGNOSIS — I10 Essential (primary) hypertension: Secondary | ICD-10-CM | POA: Diagnosis not present

## 2017-04-03 DIAGNOSIS — G8929 Other chronic pain: Secondary | ICD-10-CM

## 2017-04-03 DIAGNOSIS — F411 Generalized anxiety disorder: Secondary | ICD-10-CM

## 2017-04-03 DIAGNOSIS — M5440 Lumbago with sciatica, unspecified side: Secondary | ICD-10-CM | POA: Diagnosis not present

## 2017-04-03 MED ORDER — HYDROCODONE-ACETAMINOPHEN 10-325 MG PO TABS: 1.0000 | ORAL_TABLET | Freq: Three times a day (TID) | ORAL | 0 refills | Status: DC | PRN

## 2017-04-03 MED ORDER — LOSARTAN POTASSIUM 50 MG PO TABS: 50.0000 mg | ORAL_TABLET | Freq: Every day | ORAL | 3 refills | Status: DC

## 2017-04-03 MED ORDER — FINASTERIDE 5 MG PO TABS: 5.0000 mg | ORAL_TABLET | Freq: Every day | ORAL | 3 refills | Status: DC

## 2017-04-03 MED ORDER — METFORMIN HCL 1000 MG PO TABS: ORAL_TABLET | ORAL | 3 refills | Status: DC

## 2017-04-03 MED ORDER — LORAZEPAM 1 MG PO TABS: 1.0000 mg | ORAL_TABLET | Freq: Four times a day (QID) | ORAL | 5 refills | Status: DC | PRN

## 2017-04-03 MED ORDER — ALLOPURINOL 300 MG PO TABS: 300.0000 mg | ORAL_TABLET | Freq: Every day | ORAL | 3 refills | Status: DC

## 2017-04-03 MED ORDER — HYDROCHLOROTHIAZIDE 25 MG PO TABS: 25.0000 mg | ORAL_TABLET | Freq: Every day | ORAL | 3 refills | Status: DC

## 2017-04-03 NOTE — Addendum Note (Signed)
Addended by: Myriam Forehand on: 04/03/2017 03:59 PM   Modules accepted: Orders

## 2017-04-03 NOTE — Progress Notes (Signed)
   Subjective:    Patient ID: [name], male    DOB: 1946-03-05, 71 y.o.   MRN: [id number]  HPI Here to follow up. He is doing well in general. His glucoses and BP are stable. He has mild daily back pain but has flares of more severe back pain about 6 times a year.   Review of Systems  Constitutional: Negative.   Respiratory: Negative.   Cardiovascular: Negative.   Musculoskeletal: Positive for back pain.  Neurological: Negative.        Objective:   Physical Exam  Constitutional: He is oriented to person, place, and time. He appears well-developed and well-nourished.  Cardiovascular: Normal rate, regular rhythm, normal heart sounds and intact distal pulses.  Pulmonary/Chest: Effort normal and breath sounds normal. No respiratory distress. He has no wheezes. He has no rales.  Neurological: He is alert and oriented to person, place, and time.          Assessment & Plan:  His HTN and diabetes are stable. We will refill the pain medications. Plan on getting a well exam after the first of the year.  Alysia Penna, MD

## 2017-06-04 ENCOUNTER — Ambulatory Visit (INDEPENDENT_AMBULATORY_CARE_PROVIDER_SITE_OTHER): Payer: Medicare Other | Admitting: Family Medicine

## 2017-06-04 ENCOUNTER — Encounter: Payer: Self-pay | Admitting: Family Medicine

## 2017-06-04 VITALS — BP 132/80 | HR 76 | Temp 97.9°F | Wt 188.6 lb

## 2017-06-04 DIAGNOSIS — M5440 Lumbago with sciatica, unspecified side: Secondary | ICD-10-CM | POA: Diagnosis not present

## 2017-06-04 DIAGNOSIS — F1190 Opioid use, unspecified, uncomplicated: Secondary | ICD-10-CM | POA: Diagnosis not present

## 2017-06-04 MED ORDER — HYDROCODONE-ACETAMINOPHEN 10-325 MG PO TABS: 1.0000 | ORAL_TABLET | Freq: Three times a day (TID) | ORAL | 0 refills | Status: DC | PRN

## 2017-06-04 NOTE — Progress Notes (Signed)
   Subjective:    Patient ID: [name], male    DOB: Sep 27, 1945, 72 y.o.   MRN: [id number]  HPI Here for pain management. He has been doing well. He walks every day for exercise. Indication for chronic opioid: low back pain Medication and dose: Norco 10-325 # pills per month: 60 Last UDS date: 06-04-17 Pain contract signed (Y/N): 06-04-17 Date narcotic database last reviewed (include red flags): 06-04-17     Review of Systems  Constitutional: Negative.   Respiratory: Negative.   Cardiovascular: Negative.   Musculoskeletal: Positive for back pain.       Objective:   Physical Exam  Constitutional: He is oriented to person, place, and time. He appears well-developed and well-nourished.  Cardiovascular: Normal rate, regular rhythm, normal heart sounds and intact distal pulses.  Pulmonary/Chest: Effort normal and breath sounds normal. No respiratory distress. He has no wheezes. He has no rales.  Neurological: He is alert and oriented to person, place, and time.          Assessment & Plan:  Stable low back pain. Meds were refilled.  Alysia Penna, MD

## 2017-06-12 LAB — PAIN MGMT, PROFILE 8 W/CONF, U
6 Acetylmorphine: NEGATIVE ng/mL (ref <10)
ALPHAHYDROXYALPRAZOLAM: NEGATIVE ng/mL (ref <25)
ALPHAHYDROXYMIDAZOLAM: NEGATIVE ng/mL (ref <50)
AMPHETAMINES: NEGATIVE ng/mL (ref <500)
Alcohol Metabolites: POSITIVE ng/mL — AB (ref <500)
Alphahydroxytriazolam: NEGATIVE ng/mL (ref <50)
Aminoclonazepam: NEGATIVE ng/mL (ref <25)
Benzodiazepines: POSITIVE ng/mL — AB (ref <100)
Buprenorphine, Urine: NEGATIVE ng/mL (ref <5)
COCAINE METABOLITE: NEGATIVE ng/mL (ref <150)
CODEINE: NEGATIVE ng/mL (ref <50)
CREATININE: 132.4 mg/dL
ETHYL GLUCURONIDE (ETG): 4405 ng/mL — AB (ref <500)
Ethyl Sulfate (ETS): 1817 ng/mL — ABNORMAL HIGH (ref <100)
HYDROMORPHONE: 249 ng/mL — AB (ref <50)
Hydrocodone: 2467 ng/mL — ABNORMAL HIGH (ref <50)
Hydroxyethylflurazepam: NEGATIVE ng/mL (ref <50)
Lorazepam: 645 ng/mL — ABNORMAL HIGH (ref <50)
MDMA: NEGATIVE ng/mL (ref <500)
Marijuana Metabolite: NEGATIVE ng/mL (ref <20)
Morphine: NEGATIVE ng/mL (ref <50)
NORDIAZEPAM: NEGATIVE ng/mL (ref <50)
NORHYDROCODONE: 2883 ng/mL — AB (ref <50)
OXIDANT: NEGATIVE ug/mL (ref <200)
Opiates: POSITIVE ng/mL — AB (ref <100)
Oxazepam: NEGATIVE ng/mL (ref <50)
Oxycodone: NEGATIVE ng/mL (ref <100)
PH: 6.42 (ref 4.5–9.0)
Temazepam: NEGATIVE ng/mL (ref <50)

## 2017-06-22 ENCOUNTER — Other Ambulatory Visit: Payer: Self-pay | Admitting: Family Medicine

## 2017-06-25 NOTE — Telephone Encounter (Signed)
Last OV 06/04/2017   Rx was last refilled 09/18/2016 disp 180 with 3 refills   Sent to PCP for approval

## 2017-09-02 ENCOUNTER — Ambulatory Visit (INDEPENDENT_AMBULATORY_CARE_PROVIDER_SITE_OTHER): Payer: Medicare Other | Admitting: Family Medicine

## 2017-09-02 ENCOUNTER — Encounter: Payer: Self-pay | Admitting: Family Medicine

## 2017-09-02 VITALS — BP 118/70 | HR 86 | Temp 98.8°F | Ht 65.25 in | Wt 188.2 lb

## 2017-09-02 DIAGNOSIS — F1190 Opioid use, unspecified, uncomplicated: Secondary | ICD-10-CM | POA: Diagnosis not present

## 2017-09-02 DIAGNOSIS — M5440 Lumbago with sciatica, unspecified side: Secondary | ICD-10-CM

## 2017-09-02 MED ORDER — HYDROCODONE-ACETAMINOPHEN 10-325 MG PO TABS: 1.0000 | ORAL_TABLET | Freq: Three times a day (TID) | ORAL | 0 refills | Status: AC | PRN

## 2017-09-02 MED ORDER — HYDROCODONE-ACETAMINOPHEN 10-325 MG PO TABS: 1.0000 | ORAL_TABLET | Freq: Three times a day (TID) | ORAL | 0 refills | Status: DC | PRN

## 2017-09-02 NOTE — Progress Notes (Signed)
   Subjective:    Patient ID: [name], male    DOB: 05/09/1946, 72 y.o.   MRN: [id number]  HPI Here for pain management. He is doing well.  Indication for chronic opioid: low back pain Medication and dose: Norco 10-325 # pills per month: 180 Last UDS date: 06-04-17 Opioid Treatment Agreement signed (Y/N): 09-02-17 Opioid Treatment Agreement last reviewed with patient:  09-02-17 NCCSRS reviewed this encounter (include red flags):  09-02-17    Review of Systems  Constitutional: Negative.   Respiratory: Negative.   Cardiovascular: Negative.   Musculoskeletal: Positive for back pain.  Neurological: Negative.        Objective:   Physical Exam  Constitutional: He is oriented to person, place, and time. He appears well-developed and well-nourished.  Cardiovascular: Normal rate, regular rhythm, normal heart sounds and intact distal pulses.  Pulmonary/Chest: Effort normal and breath sounds normal. No respiratory distress. He has no wheezes. He has no rales.  Neurological: He is alert and oriented to person, place, and time.          Assessment & Plan:  Pain management, meds were refilled.  Alysia Penna, MD

## 2017-09-04 ENCOUNTER — Encounter: Payer: Self-pay | Admitting: Gastroenterology

## 2017-10-03 ENCOUNTER — Other Ambulatory Visit: Payer: Self-pay | Admitting: Family Medicine

## 2017-10-03 NOTE — Telephone Encounter (Signed)
Last OV 09/02/2017   Last refilled 04/03/2017 disp 60 with 5 refills   Sent to PCP for approval

## 2017-10-04 NOTE — Telephone Encounter (Signed)
Call in #60 with 5 rf 

## 2017-11-29 ENCOUNTER — Encounter: Payer: Self-pay | Admitting: Family Medicine

## 2017-11-29 ENCOUNTER — Ambulatory Visit (INDEPENDENT_AMBULATORY_CARE_PROVIDER_SITE_OTHER): Payer: Medicare Other | Admitting: Family Medicine

## 2017-11-29 VITALS — BP 118/78 | HR 104 | Temp 98.5°F | Ht 65.25 in | Wt 192.6 lb

## 2017-11-29 DIAGNOSIS — F1190 Opioid use, unspecified, uncomplicated: Secondary | ICD-10-CM | POA: Diagnosis not present

## 2017-11-29 DIAGNOSIS — M5440 Lumbago with sciatica, unspecified side: Secondary | ICD-10-CM | POA: Diagnosis not present

## 2017-11-29 MED ORDER — HYDROCODONE-ACETAMINOPHEN 10-325 MG PO TABS: 1.0000 | ORAL_TABLET | Freq: Three times a day (TID) | ORAL | 0 refills | Status: DC | PRN

## 2017-11-29 NOTE — Progress Notes (Signed)
   Subjective:    Patient ID: [name], male    DOB: 1945-06-15, 72 y.o.   MRN: [id number]  HPI Here for pain management. He is doing well. 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  Indication for chronic opioid: low back pain Medication and dose: Norco 10-325 # pills per month: 60 Last UDS date: 06-04-17 Opioid Treatment Agreement signed (Y/N): 09-02-17 Opioid Treatment Agreement last reviewed with patient:  11-29-17 NCCSRS reviewed this encounter (include red flags):  11-29-17    Review of Systems  Constitutional: Negative.   Respiratory: Negative.   Cardiovascular: Negative.   Musculoskeletal: Positive for back pain.  Neurological: Negative.        Objective:   Physical Exam  Constitutional: He is oriented to person, place, and time. He appears well-developed and well-nourished.  Cardiovascular: Normal rate, regular rhythm, normal heart sounds and intact distal pulses.  Pulmonary/Chest: Effort normal and breath sounds normal.  Neurological: He is alert and oriented to person, place, and time.          Assessment & Plan:  Pain management. Meds were refilled.  Alysia Penna, MD

## 2018-02-13 ENCOUNTER — Other Ambulatory Visit: Payer: Self-pay | Admitting: Family Medicine

## 2018-02-13 MED ORDER — LOSARTAN POTASSIUM 100 MG PO TABS: ORAL_TABLET | ORAL | 3 refills | Status: DC

## 2018-02-26 ENCOUNTER — Ambulatory Visit (INDEPENDENT_AMBULATORY_CARE_PROVIDER_SITE_OTHER): Payer: Medicare Other | Admitting: Family Medicine

## 2018-02-26 ENCOUNTER — Telehealth: Payer: Self-pay | Admitting: Family Medicine

## 2018-02-26 ENCOUNTER — Encounter: Payer: Self-pay | Admitting: Family Medicine

## 2018-02-26 VITALS — BP 144/92 | HR 82 | Temp 98.3°F | Wt 194.2 lb

## 2018-02-26 DIAGNOSIS — F1190 Opioid use, unspecified, uncomplicated: Secondary | ICD-10-CM | POA: Diagnosis not present

## 2018-02-26 DIAGNOSIS — E119 Type 2 diabetes mellitus without complications: Secondary | ICD-10-CM | POA: Diagnosis not present

## 2018-02-26 DIAGNOSIS — M5440 Lumbago with sciatica, unspecified side: Secondary | ICD-10-CM

## 2018-02-26 LAB — HEMOGLOBIN A1C: HEMOGLOBIN A1C: 6.6 % — AB (ref 4.6–6.5)

## 2018-02-26 MED ORDER — HYDROCODONE-ACETAMINOPHEN 10-325 MG PO TABS: 1.0000 | ORAL_TABLET | Freq: Three times a day (TID) | ORAL | 0 refills | Status: DC | PRN

## 2018-02-26 MED ORDER — DICLOFENAC SODIUM 75 MG PO TBEC: 75.0000 mg | DELAYED_RELEASE_TABLET | Freq: Two times a day (BID) | ORAL | 2 refills | Status: DC

## 2018-02-26 NOTE — Telephone Encounter (Signed)
routed incorrectly

## 2018-02-26 NOTE — Telephone Encounter (Signed)
Copied from [place] [id number]. Topic: General - Other >> Feb 26, 2018  1:22 PM Cantril, [name] wrote: Patient called in he has an appt set for 2pm today and wants to know if he can get his A1C checked today also due to the [facility] call he just received

## 2018-02-26 NOTE — Progress Notes (Signed)
   Subjective:    Patient ID: [name], male    DOB: 07/06/1945, 72 y.o.   MRN: [id number]  HPI Here for pain management. He is doing well. He will add a Diclofenac once in awhile.  Indication for chronic opioid: low back pain  Medication and dose: Norco 10-325  # pills per month: 60 Last UDS date: 06-04-17 Opioid Treatment Agreement signed (Y/N): 09-02-17 Opioid Treatment Agreement last reviewed with patient:  02-26-18 NCCSRS reviewed this encounter (include red flags):  02-26-18    Review of Systems  Constitutional: Negative.   Respiratory: Negative.   Cardiovascular: Negative.   Musculoskeletal: Positive for back pain.  Neurological: Negative.        Objective:   Physical Exam  Constitutional: He is oriented to person, place, and time. He appears well-developed and well-nourished.  Cardiovascular: Normal rate, regular rhythm, normal heart sounds and intact distal pulses.  Pulmonary/Chest: Effort normal and breath sounds normal.  Neurological: He is alert and oriented to person, place, and time.          Assessment & Plan:  Pain management, meds were refilled.  Alysia Penna, MD

## 2018-02-26 NOTE — Telephone Encounter (Signed)
Addressed at office visit 02/26/18

## 2018-03-04 ENCOUNTER — Encounter: Payer: Self-pay | Admitting: *Deleted

## 2018-03-21 ENCOUNTER — Ambulatory Visit (INDEPENDENT_AMBULATORY_CARE_PROVIDER_SITE_OTHER): Payer: Medicare Other | Admitting: Family Medicine

## 2018-03-21 ENCOUNTER — Encounter: Payer: Self-pay | Admitting: Family Medicine

## 2018-03-21 ENCOUNTER — Encounter: Payer: Self-pay | Admitting: Gastroenterology

## 2018-03-21 VITALS — BP 142/80 | HR 70 | Temp 98.3°F | Ht 65.0 in | Wt 192.1 lb

## 2018-03-21 DIAGNOSIS — F418 Other specified anxiety disorders: Secondary | ICD-10-CM | POA: Diagnosis not present

## 2018-03-21 DIAGNOSIS — N138 Other obstructive and reflux uropathy: Secondary | ICD-10-CM

## 2018-03-21 DIAGNOSIS — M5440 Lumbago with sciatica, unspecified side: Secondary | ICD-10-CM | POA: Diagnosis not present

## 2018-03-21 DIAGNOSIS — I10 Essential (primary) hypertension: Secondary | ICD-10-CM

## 2018-03-21 DIAGNOSIS — Z23 Encounter for immunization: Secondary | ICD-10-CM

## 2018-03-21 DIAGNOSIS — M150 Primary generalized (osteo)arthritis: Secondary | ICD-10-CM

## 2018-03-21 DIAGNOSIS — M159 Polyosteoarthritis, unspecified: Secondary | ICD-10-CM

## 2018-03-21 DIAGNOSIS — E119 Type 2 diabetes mellitus without complications: Secondary | ICD-10-CM | POA: Diagnosis not present

## 2018-03-21 DIAGNOSIS — F4310 Post-traumatic stress disorder, unspecified: Secondary | ICD-10-CM | POA: Diagnosis not present

## 2018-03-21 DIAGNOSIS — M1A9XX0 Chronic gout, unspecified, without tophus (tophi): Secondary | ICD-10-CM

## 2018-03-21 DIAGNOSIS — N401 Enlarged prostate with lower urinary tract symptoms: Secondary | ICD-10-CM | POA: Diagnosis not present

## 2018-03-21 LAB — URIC ACID: Uric Acid, Serum: 3.5 mg/dL — ABNORMAL LOW (ref 4.0–7.8)

## 2018-03-21 LAB — CBC WITH DIFFERENTIAL/PLATELET
BASOS PCT: 0.5 % (ref 0.0–3.0)
Basophils Absolute: 0 10*3/uL (ref 0.0–0.1)
EOS ABS: 0 10*3/uL (ref 0.0–0.7)
EOS PCT: 0.8 % (ref 0.0–5.0)
HCT: 44.6 % (ref 39.0–52.0)
Hemoglobin: 14.6 g/dL (ref 13.0–17.0)
LYMPHS PCT: 23.1 % (ref 12.0–46.0)
Lymphs Abs: 1.1 10*3/uL (ref 0.7–4.0)
MCHC: 32.7 g/dL (ref 30.0–36.0)
MCV: 85.5 fl (ref 78.0–100.0)
Monocytes Absolute: 0.3 10*3/uL (ref 0.1–1.0)
Monocytes Relative: 5.4 % (ref 3.0–12.0)
Neutro Abs: 3.4 10*3/uL (ref 1.4–7.7)
Neutrophils Relative %: 70.2 % (ref 43.0–77.0)
Platelets: 127 10*3/uL — ABNORMAL LOW (ref 150.0–400.0)
RBC: 5.22 Mil/uL (ref 4.22–5.81)
RDW: 16 % — AB (ref 11.5–15.5)
WBC: 4.9 10*3/uL (ref 4.0–10.5)

## 2018-03-21 LAB — BASIC METABOLIC PANEL
BUN: 14 mg/dL (ref 6–23)
CO2: 29 mEq/L (ref 19–32)
CREATININE: 1.07 mg/dL (ref 0.40–1.50)
Calcium: 9.8 mg/dL (ref 8.4–10.5)
Chloride: 104 mEq/L (ref 96–112)
GFR: 87.49 mL/min (ref >60.00)
GLUCOSE: 112 mg/dL — AB (ref 70–99)
POTASSIUM: 4.3 meq/L (ref 3.5–5.1)
Sodium: 139 mEq/L (ref 135–145)

## 2018-03-21 LAB — HEPATIC FUNCTION PANEL
ALBUMIN: 4.3 g/dL (ref 3.5–5.2)
ALK PHOS: 48 U/L (ref 39–117)
ALT: 24 U/L (ref 0–53)
AST: 25 U/L (ref 0–37)
Bilirubin, Direct: 0.2 mg/dL (ref 0.0–0.3)
Total Bilirubin: 0.9 mg/dL (ref 0.2–1.2)
Total Protein: 7.5 g/dL (ref 6.0–8.3)

## 2018-03-21 LAB — LIPID PANEL
CHOLESTEROL: 196 mg/dL (ref 0–200)
HDL: 53.6 mg/dL (ref >39.00)
LDL CALC: 115 mg/dL — AB (ref 0–99)
NonHDL: 142.49
TRIGLYCERIDES: 138 mg/dL (ref 0.0–149.0)
Total CHOL/HDL Ratio: 4
VLDL: 27.6 mg/dL (ref 0.0–40.0)

## 2018-03-21 LAB — PSA: PSA: 0.51 ng/mL (ref 0.10–4.00)

## 2018-03-21 LAB — TSH: TSH: 0.69 u[IU]/mL (ref 0.35–4.50)

## 2018-03-21 MED ORDER — HYDROCHLOROTHIAZIDE 25 MG PO TABS: 25.0000 mg | ORAL_TABLET | Freq: Every day | ORAL | 3 refills | Status: DC

## 2018-03-21 MED ORDER — ALLOPURINOL 300 MG PO TABS: 300.0000 mg | ORAL_TABLET | Freq: Every day | ORAL | 3 refills | Status: DC

## 2018-03-21 MED ORDER — METFORMIN HCL 1000 MG PO TABS: ORAL_TABLET | ORAL | 3 refills | Status: DC

## 2018-03-21 MED ORDER — DICLOFENAC SODIUM 75 MG PO TBEC: 75.0000 mg | DELAYED_RELEASE_TABLET | Freq: Two times a day (BID) | ORAL | 3 refills | Status: DC
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
## 2018-04-07 ENCOUNTER — Other Ambulatory Visit: Payer: Self-pay | Admitting: Family Medicine

## 2018-04-15 NOTE — Telephone Encounter (Signed)
Last fill 04/03/17  Medication not on current med list

## 2018-04-23 MED ORDER — FINASTERIDE 5 MG PO TABS: 5.0000 mg | ORAL_TABLET | Freq: Every day | ORAL | 3 refills | Status: DC

## 2018-04-23 NOTE — Addendum Note (Signed)
Addended by: Wynn Banker H on: 04/23/2018 12:07 PM   Modules accepted: Orders

## 2018-04-29 ENCOUNTER — Ambulatory Visit (AMBULATORY_SURGERY_CENTER): Payer: Self-pay | Admitting: *Deleted

## 2018-04-29 VITALS — Ht 66.5 in | Wt 193.0 lb

## 2018-04-29 DIAGNOSIS — Z1211 Encounter for screening for malignant neoplasm of colon: Secondary | ICD-10-CM

## 2018-04-29 MED ORDER — PEG 3350-KCL-NA BICARB-NACL 420 G PO SOLR: 4000.0000 mL | Freq: Once | ORAL | 0 refills | Status: AC

## 2018-04-29 NOTE — Progress Notes (Signed)
No egg or soy allergy known to patient  No issues with past sedation with any surgeries  or procedures, no intubation problems  No diet pills per patient No home 02 use per patient  No blood thinners per patient  Pt denies issues with constipation  No A fib or A flutter  EMMI video sent to pt's e mail pt declined   

## 2018-05-13 ENCOUNTER — Encounter: Payer: Self-pay | Admitting: Gastroenterology

## 2018-05-13 ENCOUNTER — Ambulatory Visit (AMBULATORY_SURGERY_CENTER): Payer: Medicare Other | Admitting: Gastroenterology

## 2018-05-13 VITALS — BP 120/75 | HR 54 | Temp 97.1°F | Resp 12 | Ht 65.0 in | Wt 192.0 lb

## 2018-05-13 DIAGNOSIS — F4310 Post-traumatic stress disorder, unspecified: Secondary | ICD-10-CM | POA: Diagnosis not present

## 2018-05-13 DIAGNOSIS — Z1211 Encounter for screening for malignant neoplasm of colon: Secondary | ICD-10-CM | POA: Diagnosis not present

## 2018-05-13 DIAGNOSIS — E119 Type 2 diabetes mellitus without complications: Secondary | ICD-10-CM | POA: Diagnosis not present

## 2018-05-13 DIAGNOSIS — Z86010 Personal history of colonic polyps: Secondary | ICD-10-CM | POA: Diagnosis not present

## 2018-05-13 DIAGNOSIS — D123 Benign neoplasm of transverse colon: Secondary | ICD-10-CM | POA: Diagnosis not present

## 2018-05-13 DIAGNOSIS — I10 Essential (primary) hypertension: Secondary | ICD-10-CM | POA: Diagnosis not present

## 2018-05-13 HISTORY — PX: COLONOSCOPY: SHX174

## 2018-05-13 MED ORDER — SODIUM CHLORIDE 0.9 % IV SOLN: 500.0000 mL | Freq: Once | INTRAVENOUS | Status: DC

## 2018-05-13 NOTE — Progress Notes (Signed)
Pt's states no medical or surgical changes since previsit or office visit. 

## 2018-05-13 NOTE — Progress Notes (Signed)
Called to room to assist during endoscopic procedure.  Patient ID and intended procedure confirmed with present staff. Received instructions for my participation in the procedure from the performing physician.  

## 2018-05-13 NOTE — Patient Instructions (Signed)
Please read handouts provided. Continue present medications. Await pathology results.      YOU HAD AN ENDOSCOPIC PROCEDURE TODAY AT THE [facility]:   Refer to the procedure report that was given to you for any specific questions about what was found during the examination.  If the procedure report does not answer your questions, please call your gastroenterologist to clarify.  If you requested that your care partner not be given the details of your procedure findings, then the procedure report has been included in a sealed envelope for you to review at your convenience later.  YOU SHOULD EXPECT: Some feelings of bloating in the abdomen. Passage of more gas than usual.  Walking can help get rid of the air that was put into your GI tract during the procedure and reduce the bloating. If you had a lower endoscopy (such as a colonoscopy or flexible sigmoidoscopy) you may notice spotting of blood in your stool or on the toilet paper. If you underwent a bowel prep for your procedure, you may not have a normal bowel movement for a few days.  Please Note:  You might notice some irritation and congestion in your nose or some drainage.  This is from the oxygen used during your procedure.  There is no need for concern and it should clear up in a day or so.  SYMPTOMS TO REPORT IMMEDIATELY:   Following lower endoscopy (colonoscopy or flexible sigmoidoscopy):  Excessive amounts of blood in the stool  Significant tenderness or worsening of abdominal pains  Swelling of the abdomen that is new, acute  Fever of 100F or higher   Following upper endoscopy (EGD)  Vomiting of blood or coffee ground material  New chest pain or pain under the shoulder blades  Painful or persistently difficult swallowing  New shortness of breath  Fever of 100F or higher  Black, tarry-looking stools  For urgent or emergent issues, a gastroenterologist can be reached at any hour by calling [phone].   DIET:  We do recommend a small meal at first, but then you may proceed to your regular diet.  Drink plenty of fluids but you should avoid alcoholic beverages for 24 hours.  ACTIVITY:  You should plan to take it easy for the rest of today and you should NOT DRIVE or use heavy machinery until tomorrow (because of the sedation medicines used during the test).    FOLLOW UP: Our staff will call the number listed on your records the next business day following your procedure to check on you and address any questions or concerns that you may have regarding the information given to you following your procedure. If we do not reach you, we will leave a message.  However, if you are feeling well and you are not experiencing any problems, there is no need to return our call.  We will assume that you have returned to your regular daily activities without incident.  If any biopsies were taken you will be contacted by phone or by letter within the next 1-3 weeks.  Please call us at [phone] if you have not heard about the biopsies in 3 weeks.    SIGNATURES/CONFIDENTIALITY: You and/or your care partner have signed paperwork which will be entered into your electronic medical record.  These signatures attest to the fact that that the information above on your After Visit Summary has been reviewed and is understood.  Full responsibility of the confidentiality of this discharge information lies with you   and/or your care-partner. 

## 2018-05-13 NOTE — Progress Notes (Signed)
Report given to PACU, vss 

## 2018-05-13 NOTE — Op Note (Signed)
[place] Patient Name: [name] Procedure Date: 05/13/2018 7:16 AM MRN: [id number] Endoscopist: Mallie Mussel L. Loletha Carrow , MD Age: 72 Referring MD:  Date of Birth: 12-29-1945 Gender: Male Account #: [id number] Procedure:                Colonoscopy Indications:              Screening for colorectal malignant neoplasm (no                            polyps 09/2007) Medicines:                Monitored Anesthesia Care Procedure:                Pre-Anesthesia Assessment:                           - Prior to the procedure, a History and Physical                            was performed, and patient medications and                            allergies were reviewed. The patient's tolerance of                            previous anesthesia was also reviewed. The risks                            and benefits of the procedure and the sedation                            options and risks were discussed with the patient.                            All questions were answered, and informed consent                            was obtained. Prior Anticoagulants: The patient has                            taken no previous anticoagulant or antiplatelet                            agents. ASA Grade Assessment: II - A patient with                            mild systemic disease. After reviewing the risks                            and benefits, the patient was deemed in                            satisfactory condition to undergo the procedure.  After obtaining informed consent, the colonoscope                            was passed under direct vision. Throughout the                            procedure, the patient's blood pressure, pulse, and                            oxygen saturations were monitored continuously. The                            Colonoscope was introduced through the anus and                            advanced to the the cecum, identified by                    appendiceal orifice and ileocecal valve. The                            colonoscopy was performed without difficulty. The                            patient tolerated the procedure well. The quality                            of the bowel preparation was good. The ileocecal                            valve, appendiceal orifice, and rectum were                            photographed. Scope In: 8:14:12 AM Scope Out: 8:27:02 AM Scope Withdrawal Time: 0 hours 10 minutes 17 seconds  Total Procedure Duration: 0 hours 12 minutes 50 seconds  Findings:                 The perianal and digital rectal examinations were                            normal.                           Multiple diverticula were found in the sigmoid                            colon. There was associated tortuosity and mucosal                            erythema.                           A 4 mm polyp was found in the transverse colon. The  polyp was sessile. The polyp was removed with a                            cold snare. Resection and retrieval were complete. Complications:            No immediate complications. Estimated Blood Loss:     Estimated blood loss was minimal. Impression:               - Diverticulosis in the sigmoid colon.                           - One 4 mm polyp in the transverse colon, removed                            with a cold snare. Resected and retrieved. Recommendation:           - Patient has a contact number available for                            emergencies. The signs and symptoms of potential                            delayed complications were discussed with the                            patient. Return to normal activities tomorrow.                            Written discharge instructions were provided to the                            patient.                           - Resume previous diet.                           - Continue present  medications.                           - Await pathology results.                           - Based on current guidelines, no repeat                            surveillance colonoscopy necessary. Henry L. Loletha Carrow, MD 05/13/2018 8:39:17 AM This report has been signed electronically.

## 2018-05-15 ENCOUNTER — Telehealth: Payer: Self-pay

## 2018-05-15 NOTE — Telephone Encounter (Signed)
  Follow up Call-  Call back number 05/13/2018  Post procedure Call Back phone  # [phone]  Permission to leave phone message Yes  Some recent data might be hidden     Patient questions:  Do you have a fever, pain , or abdominal swelling? No. Pain Score  0 *  Have you tolerated food without any problems? Yes.    Have you been able to return to your normal activities? Yes.    Do you have any questions about your discharge instructions: Diet   No. Medications  No. Follow up visit  No.  Do you have questions or concerns about your Care? No.  Actions: * If pain score is 4 or above: No action needed, pain <4.

## 2018-05-19 ENCOUNTER — Encounter: Payer: Self-pay | Admitting: Gastroenterology

## 2018-05-21 ENCOUNTER — Other Ambulatory Visit: Payer: Self-pay | Admitting: Family Medicine

## 2018-05-23 NOTE — Telephone Encounter (Signed)
Dr. Sarajane Jews please advise on refill of controlled medication.  thanks

## 2018-05-26 NOTE — Telephone Encounter (Signed)
Call in #60 with 5 rf 

## 2018-05-27 ENCOUNTER — Telehealth: Payer: Self-pay | Admitting: Family Medicine

## 2018-05-27 ENCOUNTER — Encounter: Payer: Self-pay | Admitting: Family Medicine

## 2018-05-27 ENCOUNTER — Ambulatory Visit (INDEPENDENT_AMBULATORY_CARE_PROVIDER_SITE_OTHER): Payer: Medicare Other | Admitting: Family Medicine

## 2018-05-27 VITALS — BP 140/84 | HR 68 | Temp 98.6°F | Wt 193.4 lb

## 2018-05-27 DIAGNOSIS — G8929 Other chronic pain: Secondary | ICD-10-CM | POA: Diagnosis not present

## 2018-05-27 DIAGNOSIS — M5440 Lumbago with sciatica, unspecified side: Secondary | ICD-10-CM

## 2018-05-27 DIAGNOSIS — F1190 Opioid use, unspecified, uncomplicated: Secondary | ICD-10-CM

## 2018-05-27 MED ORDER — HYDROCODONE-ACETAMINOPHEN 10-325 MG PO TABS: 1.0000 | ORAL_TABLET | Freq: Three times a day (TID) | ORAL | 0 refills | Status: DC | PRN

## 2018-05-27 MED ORDER — LORAZEPAM 1 MG PO TABS: 1.0000 mg | ORAL_TABLET | Freq: Four times a day (QID) | ORAL | 5 refills | Status: DC | PRN

## 2018-05-27 NOTE — Progress Notes (Signed)
   Subjective:    Patient ID: Ron Agee, male    DOB: [birth year], 73 y.o.   MRN: [id number]  HPI Here for pain management. He is doing well.  Indication for chronic opioid: low back pain Medication and dose: Norco 10-325  # pills per month: 60 Last UDS date: 05-27-18 Opioid Treatment Agreement signed (Y/N): 05-27-18 Opioid Treatment Agreement last reviewed with patient:  05-27-18 NCCSRS reviewed this encounter (include red flags):  05-27-18    Review of Systems  Constitutional: Negative.   Respiratory: Negative.   Cardiovascular: Negative.   Musculoskeletal: Positive for back pain.  Neurological: Negative.        Objective:   Physical Exam Constitutional:      Appearance: Normal appearance.  Cardiovascular:     Rate and Rhythm: Normal rate and regular rhythm.     Pulses: Normal pulses.     Heart sounds: Normal heart sounds.  Pulmonary:     Effort: Pulmonary effort is normal.     Breath sounds: Normal breath sounds.  Neurological:     General: No focal deficit present.     Mental Status: He is alert and oriented to person, place, and time.           Assessment & Plan:  Pain management, meds were refilled.  Alysia Penna, MD

## 2018-05-27 NOTE — Telephone Encounter (Signed)
Copied from [phone]. Topic: Quick Communication - Rx Refill/Question >> May 27, 2018  5:30 PM Waylan Rocher, [place] L wrote: Medication: HYDROcodone-acetaminophen (NORCO) 10-325 MG tablet (on backorder till 06/2018, will delete it and please res[facility])  Has the patient contacted their pharmacy? Yes.   (Agent: If no, request that the patient contact the pharmacy for the refill.) (Agent: If yes, when and what did the pharmacy advise?)  Preferred Pharmacy (with phone number or street name): [facility] [place], [place] - [address] has it and the both  [facility] [address]   Agent: Please be advised that RX refills may take up to 3 business days. We ask that you follow-up with your pharmacy.

## 2018-05-27 NOTE — Telephone Encounter (Signed)
Patient wants hydrocodone sent to  [facility] [place], [address]

## 2018-05-28 MED ORDER — HYDROCODONE-ACETAMINOPHEN 10-325 MG PO TABS: 1.0000 | ORAL_TABLET | Freq: Three times a day (TID) | ORAL | 0 refills | Status: AC | PRN

## 2018-05-28 NOTE — Telephone Encounter (Signed)
I [facility] in for [facility] at [facility]

## 2018-05-28 NOTE — Telephone Encounter (Signed)
Called and spoke with pt and he is aware of the rx that has been sent to the new pharmacy.

## 2018-05-28 NOTE — Telephone Encounter (Signed)
Dr. Fry please advise. Thanks  

## 2018-05-30 LAB — PAIN MGMT, PROFILE 8 W/CONF, U
6 Acetylmorphine: NEGATIVE ng/mL (ref <10)
Alcohol Metabolites: POSITIVE ng/mL — AB (ref <500)
Amphetamines: NEGATIVE ng/mL (ref <500)
Benzodiazepines: NEGATIVE ng/mL (ref <100)
Buprenorphine, Urine: NEGATIVE ng/mL (ref <5)
Cocaine Metabolite: NEGATIVE ng/mL (ref <150)
Codeine: NEGATIVE ng/mL (ref <50)
Creatinine: 134.3 mg/dL
Ethyl Glucuronide (ETG): 1791 ng/mL — ABNORMAL HIGH (ref <500)
Ethyl Sulfate (ETS): 831 ng/mL — ABNORMAL HIGH (ref <100)
HYDROMORPHONE: 261 ng/mL — AB (ref <50)
Hydrocodone: 1974 ng/mL — ABNORMAL HIGH (ref <50)
MDMA: NEGATIVE ng/mL (ref <500)
Marijuana Metabolite: NEGATIVE ng/mL (ref <20)
Morphine: NEGATIVE ng/mL (ref <50)
Norhydrocodone: 2092 ng/mL — ABNORMAL HIGH (ref <50)
Opiates: POSITIVE ng/mL — AB (ref <100)
Oxidant: NEGATIVE ug/mL (ref <200)
Oxycodone: NEGATIVE ng/mL (ref <100)
pH: 6.05 (ref 4.5–9.0)

## 2018-06-10 ENCOUNTER — Encounter: Payer: Self-pay | Admitting: Family Medicine

## 2018-06-10 DIAGNOSIS — H5213 Myopia, bilateral: Secondary | ICD-10-CM | POA: Diagnosis not present

## 2018-06-10 DIAGNOSIS — Z7984 Long term (current) use of oral hypoglycemic drugs: Secondary | ICD-10-CM | POA: Diagnosis not present

## 2018-06-10 DIAGNOSIS — E119 Type 2 diabetes mellitus without complications: Secondary | ICD-10-CM | POA: Diagnosis not present

## 2018-06-10 DIAGNOSIS — H11159 Pinguecula, unspecified eye: Secondary | ICD-10-CM | POA: Diagnosis not present

## 2018-06-10 DIAGNOSIS — H524 Presbyopia: Secondary | ICD-10-CM | POA: Diagnosis not present

## 2018-06-10 DIAGNOSIS — H18419 Arcus senilis, unspecified eye: Secondary | ICD-10-CM | POA: Diagnosis not present

## 2018-06-10 DIAGNOSIS — H52223 Regular astigmatism, bilateral: Secondary | ICD-10-CM | POA: Diagnosis not present

## 2018-06-10 DIAGNOSIS — I10 Essential (primary) hypertension: Secondary | ICD-10-CM | POA: Diagnosis not present

## 2018-06-10 LAB — HM DIABETES EYE EXAM

## 2018-06-17 ENCOUNTER — Other Ambulatory Visit: Payer: Self-pay | Admitting: Family Medicine

## 2018-07-22 DIAGNOSIS — G8929 Other chronic pain: Secondary | ICD-10-CM | POA: Diagnosis not present

## 2018-07-22 DIAGNOSIS — M25561 Pain in right knee: Secondary | ICD-10-CM | POA: Diagnosis not present

## 2018-07-29 ENCOUNTER — Other Ambulatory Visit: Payer: Self-pay | Admitting: Orthopedic Surgery

## 2018-07-29 DIAGNOSIS — R531 Weakness: Secondary | ICD-10-CM

## 2018-07-29 DIAGNOSIS — R52 Pain, unspecified: Secondary | ICD-10-CM

## 2018-08-01 ENCOUNTER — Ambulatory Visit
Admission: RE | Admit: 2018-08-01 | Discharge: 2018-08-01 | Disposition: A | Discharge status: Home / Self Care | Payer: Medicare Other | Source: Ambulatory Visit | Attending: Orthopedic Surgery | Admitting: Orthopedic Surgery

## 2018-08-01 ENCOUNTER — Other Ambulatory Visit: Payer: Self-pay

## 2018-08-01 DIAGNOSIS — R531 Weakness: Secondary | ICD-10-CM

## 2018-08-01 DIAGNOSIS — R52 Pain, unspecified: Secondary | ICD-10-CM

## 2018-08-01 DIAGNOSIS — M25561 Pain in right knee: Secondary | ICD-10-CM | POA: Diagnosis not present

## 2018-08-27 ENCOUNTER — Ambulatory Visit (INDEPENDENT_AMBULATORY_CARE_PROVIDER_SITE_OTHER): Payer: Medicare Other | Admitting: Family Medicine

## 2018-08-27 ENCOUNTER — Encounter: Payer: Self-pay | Admitting: Family Medicine

## 2018-08-27 ENCOUNTER — Other Ambulatory Visit: Payer: Self-pay

## 2018-08-27 DIAGNOSIS — F1190 Opioid use, unspecified, uncomplicated: Secondary | ICD-10-CM | POA: Diagnosis not present

## 2018-08-27 DIAGNOSIS — M5440 Lumbago with sciatica, unspecified side: Secondary | ICD-10-CM | POA: Diagnosis not present

## 2018-08-27 MED ORDER — HYDROCODONE-ACETAMINOPHEN 10-325 MG PO TABS: 1.0000 | ORAL_TABLET | Freq: Two times a day (BID) | ORAL | 0 refills | Status: DC

## 2018-08-27 MED ORDER — HYDROCODONE-ACETAMINOPHEN 10-325 MG PO TABS: 1.0000 | ORAL_TABLET | Freq: Two times a day (BID) | ORAL | 0 refills | Status: AC

## 2018-08-27 NOTE — Progress Notes (Signed)
Subjective:    Patient ID: [name], male    DOB: 1946-02-19, 73 y.o.   MRN: [id number]  HPI Virtual Visit via Video Note  I connected with the patient on 08/27/18 at  9:30 AM EDT by a video enabled telemedicine application and verified that I am speaking with the correct person using two identifiers.  Location patient: home Location provider:work or home office Persons participating in the virtual visit: patient, provider  I discussed the limitations of evaluation and management by telemedicine and the availability of in person appointments. The patient expressed understanding and agreed to proceed.   HPI: Here for pain management. He is doing well as far as the back pain. He was supposed to have knee surgery this spring, but this was postponed due the virus pandemic, and now the knee pain is an issue. Taking 2 Norco a day is sufficient. Trying to walk some for exercise.  Indication for chronic opioid: low back pain Medication and dose: Norco 10-325 # pills per month: 60 Last UDS date: 05-27-18 Opioid Treatment Agreement signed (Y/N): 09-06-17 Opioid Treatment Agreement last reviewed with patient:  08-27-18 NCCSRS reviewed this encounter (include red flags):  08-27-18    ROS: See pertinent positives and negatives per HPI.  Past Medical History:  Diagnosis Date  . Anxiety    sees Dr. Roselyn Bering and Richardo Priest  . Arthritis   . Blood transfusion without reported diagnosis   . BPH with urinary obstruction   . Chickenpox   . Depression    sees Dr. Abner Greenspan and Richardo Priest  . Diabetes mellitus without complication ([place])   . ED (erectile dysfunction)   . Hypertension   . Neuromuscular disorder ([place])    occ issues with nerves in arms and hands   . PTSD (post-traumatic stress disorder)    sees Dr. Roselyn Bering    Past Surgical History:  Procedure Laterality Date  . COLONOSCOPY  10-10-07   per Dr. Deatra Ina, clear, repeat in 10 yrs   . LUMBAR FUSION    . SHOULDER SURGERY   2017    Family History  Problem Relation Age of Onset  . Arthritis Other   . Coronary artery disease Other   . Diabetes Other   . Hyperlipidemia Other   . Hypertension Other   . Kidney disease Other   . Colon cancer Neg Hx   . Colon polyps Neg Hx   . Esophageal cancer Neg Hx   . Rectal cancer Neg Hx   . Stomach cancer Neg Hx      Current Outpatient Medications:  .  allopurinol (ZYLOPRIM) 300 MG tablet, Take 1 tablet (300 mg total) by mouth daily., Disp: 90 tablet, Rfl: 3 .  diclofenac (VOLTAREN) 75 MG EC tablet, Take 1 tablet (75 mg total) by mouth 2 (two) times daily., Disp: 180 tablet, Rfl: 3 .  finasteride (PROSCAR) 5 MG tablet, Take 1 tablet (5 mg total) by mouth daily., Disp: 90 tablet, Rfl: 3 .  hydrochlorothiazide (HYDRODIURIL) 25 MG tablet, Take 1 tablet (25 mg total) by mouth daily., Disp: 90 tablet, Rfl: 3 .  [START ON 10/27/2018] HYDROcodone-acetaminophen (NORCO) 10-325 MG tablet, Take 1 tablet by mouth 2 (two) times daily for 30 days., Disp: 60 tablet, Rfl: 0 .  LORazepam (ATIVAN) 1 MG tablet, Take 1 tablet (1 mg total) by mouth every 6 (six) hours as needed. for anxiety, Disp: 60 tablet, Rfl: 5 .  losartan (COZAAR) 100 MG tablet, Take 1/2 tablet daily, Disp:  45 tablet, Rfl: 3 .  metFORMIN (GLUCOPHAGE) 1000 MG tablet, TAKE 1 TABLET BY MOUTH TWICE A DAY WITH A MEAL, Disp: 180 tablet, Rfl: 3 .  tamsulosin (FLOMAX) 0.4 MG CAPS capsule, TAKE ONE CAPSULE BY MOUTH TWICE A DAY AT 10AM AND 5PM, Disp: 180 capsule, Rfl: 3  EXAM:  VITALS per patient if applicable:  GENERAL: alert, oriented, appears well and in no acute distress  HEENT: atraumatic, conjunttiva clear, no obvious abnormalities on inspection of external nose and ears  NECK: normal movements of the head and neck  LUNGS: on inspection no signs of respiratory distress, breathing rate appears normal, no obvious gross SOB, gasping or wheezing  CV: no obvious cyanosis  MS: moves all visible extremities without  noticeable abnormality  PSYCH/NEURO: pleasant and cooperative, no obvious depression or anxiety, speech and thought processing grossly intact  ASSESSMENT AND PLAN: Pain management, meds were refilled.  Alysia Penna, MD  Discussed the following assessment and plan:  Chronic narcotic use  Bilateral low back pain with sciatica, sciatica laterality unspecified, unspecified chronicity     I discussed the assessment and treatment plan with the patient. The patient was provided an opportunity to ask questions and all were answered. The patient agreed with the plan and demonstrated an understanding of the instructions.   The patient was advised to call back or seek an in-person evaluation if the symptoms worsen or if the condition fails to improve as anticipated.     Review of Systems     Objective:   Physical Exam        Assessment & Plan:

## 2018-10-22 DIAGNOSIS — M659 Synovitis and tenosynovitis, unspecified: Secondary | ICD-10-CM | POA: Diagnosis not present

## 2018-10-22 DIAGNOSIS — M6751 Plica syndrome, right knee: Secondary | ICD-10-CM | POA: Diagnosis not present

## 2018-10-22 DIAGNOSIS — X58XXXA Exposure to other specified factors, initial encounter: Secondary | ICD-10-CM | POA: Diagnosis not present

## 2018-10-22 DIAGNOSIS — S83231A Complex tear of medial meniscus, current injury, right knee, initial encounter: Secondary | ICD-10-CM | POA: Diagnosis not present

## 2018-10-22 DIAGNOSIS — Y999 Unspecified external cause status: Secondary | ICD-10-CM | POA: Diagnosis not present

## 2018-10-22 DIAGNOSIS — M94261 Chondromalacia, right knee: Secondary | ICD-10-CM | POA: Diagnosis not present

## 2018-10-27 DIAGNOSIS — M25561 Pain in right knee: Secondary | ICD-10-CM | POA: Diagnosis not present

## 2018-10-27 DIAGNOSIS — Z9889 Other specified postprocedural states: Secondary | ICD-10-CM | POA: Diagnosis not present

## 2018-10-27 DIAGNOSIS — M25661 Stiffness of right knee, not elsewhere classified: Secondary | ICD-10-CM | POA: Diagnosis not present

## 2018-10-27 DIAGNOSIS — R262 Difficulty in walking, not elsewhere classified: Secondary | ICD-10-CM | POA: Diagnosis not present

## 2018-11-03 DIAGNOSIS — R262 Difficulty in walking, not elsewhere classified: Secondary | ICD-10-CM | POA: Diagnosis not present

## 2018-11-03 DIAGNOSIS — M25561 Pain in right knee: Secondary | ICD-10-CM | POA: Diagnosis not present

## 2018-11-03 DIAGNOSIS — Z9889 Other specified postprocedural states: Secondary | ICD-10-CM | POA: Diagnosis not present

## 2018-11-03 DIAGNOSIS — M25661 Stiffness of right knee, not elsewhere classified: Secondary | ICD-10-CM | POA: Diagnosis not present

## 2018-11-12 DIAGNOSIS — M25561 Pain in right knee: Secondary | ICD-10-CM | POA: Diagnosis not present

## 2018-11-12 DIAGNOSIS — Z9889 Other specified postprocedural states: Secondary | ICD-10-CM | POA: Diagnosis not present

## 2018-11-12 DIAGNOSIS — R262 Difficulty in walking, not elsewhere classified: Secondary | ICD-10-CM | POA: Diagnosis not present

## 2018-11-12 DIAGNOSIS — M25661 Stiffness of right knee, not elsewhere classified: Secondary | ICD-10-CM | POA: Diagnosis not present

## 2018-11-16 ENCOUNTER — Other Ambulatory Visit: Payer: Self-pay | Admitting: Family Medicine

## 2018-11-28 ENCOUNTER — Ambulatory Visit (INDEPENDENT_AMBULATORY_CARE_PROVIDER_SITE_OTHER): Payer: Medicare Other | Admitting: Family Medicine

## 2018-11-28 ENCOUNTER — Other Ambulatory Visit: Payer: Self-pay

## 2018-11-28 ENCOUNTER — Encounter: Payer: Self-pay | Admitting: Family Medicine

## 2018-11-28 DIAGNOSIS — M5440 Lumbago with sciatica, unspecified side: Secondary | ICD-10-CM

## 2018-11-28 DIAGNOSIS — F1190 Opioid use, unspecified, uncomplicated: Secondary | ICD-10-CM | POA: Diagnosis not present

## 2018-11-28 NOTE — Progress Notes (Signed)
   Subjective:    Patient ID: [name], male    DOB: [birth year], 73 y.o.   MRN: [id number]  HPI Virtual Visit via Telephone Note  I connected with the patient on 11/28/18 at 10:30 AM EDT by telephone and verified that I am speaking with the correct person using two identifiers. We attempted to connect virtually but we had technical difficulties with the audio and video.     I discussed the limitations, risks, security and privacy concerns of performing an evaluation and management service by telephone and the availability of in person appointments. I also discussed with the patient that there may be a patient responsible charge related to this service. The patient expressed understanding and agreed to proceed.  Location patient: home Location provider: work or home office Participants present for the call: patient, provider Patient did not have a visit in the prior 7 days to address this/these issue(s).   History of Present Illness: Here for pain management. He is doing well. He finally had his knee surgery 6 weeks ago and this went very well.  Indication for chronic opioid: low back pain Medication and dose: Norco 10-325 # pills per month: 60 Last UDS date: 05-27-18 Opioid Treatment Agreement signed (Y/N): 09-06-17 Opioid Treatment Agreement last reviewed with patient:  11-28-18 NCCSRS reviewed this encounter (include red flags):  11-28-18    Observations/Objective: Patient sounds cheerful and well on the phone. I do not appreciate any SOB. Speech and thought processing are grossly intact. Patient reported vitals:  Assessment and Plan: Pain management, meds were refilled.  Alysia Penna, MD   Follow Up Instructions:     [id number] 5-10 [phone] 11-20 [id number] 21-30 I did not refer this patient for an OV in the next 24 hours for this/these issue(s).  I discussed the assessment and treatment plan with the patient. The patient was provided an opportunity to ask questions and all  were answered. The patient agreed with the plan and demonstrated an understanding of the instructions.   The patient was advised to call back or seek an in-person evaluation if the symptoms worsen or if the condition fails to improve as anticipated.  I provided 10 minutes of non-face-to-face time during this encounter.   Alysia Penna, MD    Review of Systems     Objective:   Physical Exam        Assessment & Plan:

## 2018-12-02 ENCOUNTER — Other Ambulatory Visit: Payer: Self-pay

## 2018-12-02 MED ORDER — LOSARTAN POTASSIUM 100 MG PO TABS: ORAL_TABLET | ORAL | 3 refills | Status: DC

## 2018-12-03 ENCOUNTER — Telehealth: Payer: Self-pay | Admitting: Family Medicine

## 2018-12-03 NOTE — Telephone Encounter (Signed)
Copied from [place] [id number]. Topic: Quick Communication - Rx Refill/Question >> Dec 03, 2018  8:47 AM Erick Blinks wrote: Medication: HYDROcodone-acetaminophen (NORCO) 10-325 MG tablet  Has the patient contacted their pharmacy? Yes (Agent: If no, request that the patient contact the pharmacy for the refill.) (Agent: If yes, when and what did the pharmacy advise?)  Preferred Pharmacy (with phone number or street name[id number] [place] [place] - [address] [place] [address] Phone: [phone] Fax: [phone]    Agent: Please be advised that RX refills may take up to 3 business days. We ask that you follow-up with your pharmacy.

## 2018-12-03 NOTE — Telephone Encounter (Signed)
Last filled 10/27/18 Last OV 11/28/2018  Ok to refill?

## 2018-12-05 ENCOUNTER — Other Ambulatory Visit: Payer: Self-pay

## 2018-12-05 MED ORDER — HYDROCODONE-ACETAMINOPHEN 10-325 MG PO TABS: 1.0000 | ORAL_TABLET | Freq: Two times a day (BID) | ORAL | 0 refills | Status: DC | PRN

## 2018-12-05 NOTE — Telephone Encounter (Signed)
Done

## 2018-12-05 NOTE — Telephone Encounter (Signed)
Recieved

## 2018-12-15 ENCOUNTER — Other Ambulatory Visit: Payer: Self-pay | Admitting: Family Medicine

## 2018-12-16 NOTE — Telephone Encounter (Signed)
Last filled 05/27/2018 Last OV 11/28/2018  Ok to fill?

## 2019-01-29 ENCOUNTER — Other Ambulatory Visit: Payer: Self-pay

## 2019-01-29 ENCOUNTER — Ambulatory Visit (INDEPENDENT_AMBULATORY_CARE_PROVIDER_SITE_OTHER): Payer: Medicare Other

## 2019-01-29 DIAGNOSIS — Z23 Encounter for immunization: Secondary | ICD-10-CM | POA: Diagnosis not present

## 2019-03-03 ENCOUNTER — Other Ambulatory Visit: Payer: Self-pay

## 2019-03-03 ENCOUNTER — Ambulatory Visit (INDEPENDENT_AMBULATORY_CARE_PROVIDER_SITE_OTHER): Payer: Medicare Other | Admitting: Family Medicine

## 2019-03-03 ENCOUNTER — Encounter: Payer: Self-pay | Admitting: Family Medicine

## 2019-03-03 VITALS — BP 124/66 | HR 85 | Temp 98.4°F | Resp 16 | Ht 65.0 in | Wt 199.0 lb

## 2019-03-03 DIAGNOSIS — M545 Low back pain, unspecified: Secondary | ICD-10-CM

## 2019-03-03 DIAGNOSIS — G8929 Other chronic pain: Secondary | ICD-10-CM | POA: Diagnosis not present

## 2019-03-03 MED ORDER — HYDROCODONE-ACETAMINOPHEN 10-325 MG PO TABS: ORAL_TABLET | ORAL | 0 refills | Status: DC

## 2019-03-03 NOTE — Progress Notes (Signed)
  Subjective:     Patient ID: [name], male   DOB: 1945/11/28, 73 y.o.   MRN: [id number]  HPI   [name] is seen for chronic pain management follow-up in absence of primary who had to be out this week.  He has some chronic low back pain.  He had lumbar fusion sometime back around the mid 70s.  He has been on hydrocodone 10/325 mg 1 twice daily.  He particularly has severe pain in the morning when he first gets up.  Medication helps him to get going to be more functional.  He has not gotten adequately for the over-the-counter medications.  Denies any side effects.  Last pain management profile 1/20.  Past Medical History:  Diagnosis Date  . Anxiety    sees Dr. Roselyn Bering and Richardo Priest  . Arthritis   . Blood transfusion without reported diagnosis   . BPH with urinary obstruction   . Chickenpox   . Depression    sees Dr. Abner Greenspan and Richardo Priest  . Diabetes mellitus without complication ([place])   . ED (erectile dysfunction)   . Hypertension   . Neuromuscular disorder ([place])    occ issues with nerves in arms and hands   . PTSD (post-traumatic stress disorder)    sees Dr. Roselyn Bering   Past Surgical History:  Procedure Laterality Date  . COLONOSCOPY  10-10-07   per Dr. Deatra Ina, clear, repeat in 10 yrs   . LUMBAR FUSION    . SHOULDER SURGERY  2017    reports that he has quit smoking. He has never used smokeless tobacco. He reports current alcohol use. He reports that he does not use drugs. family history includes Arthritis in an other family member; Coronary artery disease in an other family member; Diabetes in an other family member; Hyperlipidemia in an other family member; Hypertension in an other family member; Kidney disease in an other family member. Allergies  Allergen Reactions  . Abilify [Aripiprazole] Other (See Comments)    lesions       Review of Systems  Constitutional: Negative for appetite change, chills, fever and unexpected weight change.  Respiratory:  Negative for shortness of breath.   Cardiovascular: Negative for chest pain.  Musculoskeletal: Positive for back pain.  Neurological: Negative for weakness and numbness.       Objective:   Physical Exam Constitutional:      Appearance: Normal appearance.  Cardiovascular:     Rate and Rhythm: Normal rate and regular rhythm.  Pulmonary:     Effort: Pulmonary effort is normal.     Breath sounds: Normal breath sounds.  Musculoskeletal:     Right lower leg: No edema.     Left lower leg: No edema.  Neurological:     Mental Status: He is alert.        Assessment:     Chronic low back pain- stable    Plan:     -Refilled his hydrocodone for 1 month until primary provider returns. -[place] controlled substance registry reviewed and no concerns.   Eulas Post MD [facility]

## 2019-04-07 ENCOUNTER — Other Ambulatory Visit: Payer: Self-pay | Admitting: Family Medicine

## 2019-04-07 NOTE — Telephone Encounter (Signed)
Okay for refill?   Pt had PMV 02/2019 with Dr.Burchette in the absence of Dr.Fry.

## 2019-04-07 NOTE — Telephone Encounter (Signed)
Medication Refill - Medication: HYDROcodone-acetaminophen (NORCO) 10-325 MG tablet   Pt is out of medication as of today.  Has the patient contacted their pharmacy? No. (Agent: If no, request that the patient contact the pharmacy for the refill.) (Agent: If yes, when and what did the pharmacy advise?)Controlled  Preferred Pharmacy (with phone number or street name):  [id number] [place], [place] - [address] [phone] (Phone) [phone] (Fax)     Agent: Please be advised that RX refills may take up to 3 business days. We ask that you follow-up with your pharmacy.

## 2019-04-07 NOTE — Telephone Encounter (Signed)
This medication is not delegated.  Routing to provider 

## 2019-04-08 ENCOUNTER — Other Ambulatory Visit: Payer: Self-pay

## 2019-04-08 MED ORDER — HYDROCODONE-ACETAMINOPHEN 10-325 MG PO TABS: ORAL_TABLET | ORAL | 0 refills | Status: DC

## 2019-04-08 NOTE — Telephone Encounter (Signed)
I refilled this for 2 more months. Then he will need another PMV

## 2019-04-28 ENCOUNTER — Other Ambulatory Visit: Payer: Self-pay | Admitting: Family Medicine

## 2019-04-28 NOTE — Telephone Encounter (Signed)
Patient states he's completley out and would like request expedited for finasteride (PROSCAR) 5 MG tablet and allopurinol (ZYLOPRIM) 300 MG tablet. Advised patient in the future please allow 48 to 72 hour turn around time   [id number] [place], [place] - [address]

## 2019-05-02 ENCOUNTER — Other Ambulatory Visit: Payer: Self-pay | Admitting: Family Medicine

## 2019-06-05 ENCOUNTER — Encounter: Payer: Self-pay | Admitting: Family Medicine

## 2019-06-05 ENCOUNTER — Ambulatory Visit (INDEPENDENT_AMBULATORY_CARE_PROVIDER_SITE_OTHER): Payer: Medicare Other | Admitting: Family Medicine

## 2019-06-05 ENCOUNTER — Other Ambulatory Visit: Payer: Self-pay

## 2019-06-05 VITALS — BP 136/70 | HR 118 | Temp 97.4°F | Wt 194.6 lb

## 2019-06-05 DIAGNOSIS — M5440 Lumbago with sciatica, unspecified side: Secondary | ICD-10-CM

## 2019-06-05 DIAGNOSIS — F1190 Opioid use, unspecified, uncomplicated: Secondary | ICD-10-CM | POA: Diagnosis not present

## 2019-06-05 MED ORDER — HYDROCODONE-ACETAMINOPHEN 10-325 MG PO TABS: 1.0000 | ORAL_TABLET | Freq: Four times a day (QID) | ORAL | 0 refills | Status: DC | PRN

## 2019-06-05 MED ORDER — LORAZEPAM 1 MG PO TABS: 1.0000 mg | ORAL_TABLET | Freq: Four times a day (QID) | ORAL | 5 refills | Status: DC | PRN

## 2019-06-05 MED ORDER — HYDROCODONE-ACETAMINOPHEN 10-325 MG PO TABS: 1.0000 | ORAL_TABLET | Freq: Four times a day (QID) | ORAL | 0 refills | Status: AC | PRN

## 2019-06-05 NOTE — Progress Notes (Signed)
   Subjective:    Patient ID: [name], male    DOB: Nov 24, 1945, 74 y.o.   MRN: [id number]  HPI Here for pain management, he is doing well.  Indication for chronic opioid: low back pain Medication and dose: Norco 10-325 # pills per month: 60 Last UDS date:05-27-18 Opioid Treatment Agreement signed (Y/N): 09-06-17 Opioid Treatment Agreement last reviewed with patient:  06-05-19 NCCSRS reviewed this encounter (include red flags): Yes    Review of Systems     Objective:   Physical Exam        Assessment & Plan:  Pain management, meds were refilled.  Alysia Penna, MD

## 2019-06-27 ENCOUNTER — Other Ambulatory Visit: Payer: Self-pay | Admitting: Family Medicine

## 2019-07-17 ENCOUNTER — Other Ambulatory Visit: Payer: Self-pay | Admitting: Family Medicine

## 2019-07-17 NOTE — Telephone Encounter (Signed)
Last filled 06/05/2019 Last OV 06/05/2019  Ok to fill?

## 2019-09-07 ENCOUNTER — Other Ambulatory Visit: Payer: Self-pay

## 2019-09-07 ENCOUNTER — Ambulatory Visit (INDEPENDENT_AMBULATORY_CARE_PROVIDER_SITE_OTHER): Payer: Medicare Other | Admitting: Family Medicine

## 2019-09-07 ENCOUNTER — Encounter: Payer: Self-pay | Admitting: Family Medicine

## 2019-09-07 VITALS — BP 150/100 | HR 84 | Temp 98.0°F | Ht 65.0 in | Wt 203.0 lb

## 2019-09-07 DIAGNOSIS — F1190 Opioid use, unspecified, uncomplicated: Secondary | ICD-10-CM | POA: Diagnosis not present

## 2019-09-07 DIAGNOSIS — M5440 Lumbago with sciatica, unspecified side: Secondary | ICD-10-CM | POA: Diagnosis not present

## 2019-09-07 MED ORDER — HYDROCODONE-ACETAMINOPHEN 10-325 MG PO TABS: 1.0000 | ORAL_TABLET | Freq: Three times a day (TID) | ORAL | 0 refills | Status: DC | PRN

## 2019-09-07 MED ORDER — HYDROCODONE-ACETAMINOPHEN 10-325 MG PO TABS: 1.0000 | ORAL_TABLET | Freq: Three times a day (TID) | ORAL | 0 refills | Status: AC | PRN

## 2019-09-07 NOTE — Progress Notes (Signed)
   Subjective:    Patient ID: [name], male    DOB: Sep 09, 1945, 74 y.o.   MRN: [id number]  HPI Here for pain management, he is doing well. He asks if he can have more doses available per month so he won't run out.  Indication for chronic opioid: low back pain  Medication and dose: Norco 10-325  # pills per month: 90 Last UDS date: 09-07-19 Opioid Treatment Agreement signed (Y/N): 09-06-17 Opioid Treatment Agreement last reviewed with patient:  09-07-19  NCCSRS reviewed this encounter (include red flags): Yes    Review of Systems     Objective:   Physical Exam        Assessment & Plan:  Pain management, meds were refilled.  Alysia Penna, MD

## 2019-09-11 LAB — PAIN MGMT, PROFILE 8 W/CONF, U
6 Acetylmorphine: NEGATIVE ng/mL
Alcohol Metabolites: NEGATIVE ng/mL (ref <500)
Alphahydroxyalprazolam: NEGATIVE ng/mL
Alphahydroxymidazolam: NEGATIVE ng/mL
Alphahydroxytriazolam: NEGATIVE ng/mL
Aminoclonazepam: NEGATIVE ng/mL
Amphetamines: NEGATIVE ng/mL
Benzodiazepines: POSITIVE ng/mL
Buprenorphine, Urine: NEGATIVE ng/mL
Cocaine Metabolite: NEGATIVE ng/mL
Codeine: NEGATIVE ng/mL
Creatinine: 202 mg/dL
Hydrocodone: 220 ng/mL
Hydromorphone: 112 ng/mL
Hydroxyethylflurazepam: NEGATIVE ng/mL
Lorazepam: 673 ng/mL
MDMA: NEGATIVE ng/mL
Marijuana Metabolite: NEGATIVE ng/mL
Morphine: NEGATIVE ng/mL
Nordiazepam: NEGATIVE ng/mL
Norhydrocodone: 448 ng/mL
Noroxycodone: 644 ng/mL
Opiates: POSITIVE ng/mL
Oxazepam: NEGATIVE ng/mL
Oxidant: NEGATIVE ug/mL
Oxycodone: 120 ng/mL
Oxycodone: POSITIVE ng/mL
Oxymorphone: 238 ng/mL
Temazepam: NEGATIVE ng/mL
pH: 5.4 (ref 4.5–9.0)

## 2019-09-15 ENCOUNTER — Other Ambulatory Visit: Payer: Self-pay | Admitting: Family Medicine

## 2019-09-15 NOTE — Telephone Encounter (Signed)
Please advise. Metformin and diclofenac have not been filled since 2019.

## 2019-12-08 ENCOUNTER — Other Ambulatory Visit: Payer: Self-pay

## 2019-12-08 ENCOUNTER — Ambulatory Visit (INDEPENDENT_AMBULATORY_CARE_PROVIDER_SITE_OTHER): Payer: Medicare Other | Admitting: Family Medicine

## 2019-12-08 ENCOUNTER — Encounter: Payer: Self-pay | Admitting: Family Medicine

## 2019-12-08 VITALS — BP 140/80 | HR 97 | Temp 98.5°F | Wt 204.2 lb

## 2019-12-08 DIAGNOSIS — F1190 Opioid use, unspecified, uncomplicated: Secondary | ICD-10-CM | POA: Diagnosis not present

## 2019-12-08 DIAGNOSIS — M5440 Lumbago with sciatica, unspecified side: Secondary | ICD-10-CM | POA: Diagnosis not present

## 2019-12-08 MED ORDER — HYDROCODONE-ACETAMINOPHEN 10-325 MG PO TABS: 1.0000 | ORAL_TABLET | Freq: Three times a day (TID) | ORAL | 0 refills | Status: DC | PRN

## 2019-12-08 MED ORDER — HYDROCODONE-ACETAMINOPHEN 10-325 MG PO TABS: 1.0000 | ORAL_TABLET | Freq: Three times a day (TID) | ORAL | 0 refills | Status: AC | PRN

## 2019-12-08 NOTE — Progress Notes (Signed)
° °  Subjective:    Patient ID: [name], male    DOB: 07-03-1945, 74 y.o.   MRN: [id number]  HPI Here for pain management, he is doing well.  Indication for chronic opioid: low back pain Medication and dose: Norco 10-325 # pills per month: 90 Last UDS date: 09-07-19 Opioid Treatment Agreement signed (Y/N): 09-06-17 Opioid Treatment Agreement last reviewed with patient:  12-08-19 NCCSRS reviewed this encounter (include red flags): Yes    Review of Systems     Objective:   Physical Exam        Assessment & Plan:  Pain management, meds were refilled.  Alysia Penna, MD

## 2020-01-03 ENCOUNTER — Encounter: Payer: Self-pay | Admitting: Family Medicine

## 2020-01-06 NOTE — Telephone Encounter (Signed)
Have him make an in person OV to discuss his diabetes. He should come in fasting for labs (it has been 2 years since we checked this)

## 2020-02-09 ENCOUNTER — Other Ambulatory Visit: Payer: Self-pay | Admitting: Family Medicine

## 2020-02-09 DIAGNOSIS — N401 Enlarged prostate with lower urinary tract symptoms: Secondary | ICD-10-CM

## 2020-02-09 DIAGNOSIS — M1A9XX0 Chronic gout, unspecified, without tophus (tophi): Secondary | ICD-10-CM

## 2020-02-09 NOTE — Telephone Encounter (Signed)
Patient overdue for labs and was supposed to do a F/U diabetes, but has not scheduled that. His allopurinol and finasteride are due to refill. Scheduled him an OV for 10/5 and sent 30-day supply of both meds to pharmacy.

## 2020-02-16 ENCOUNTER — Other Ambulatory Visit: Payer: Self-pay

## 2020-02-16 ENCOUNTER — Encounter: Payer: Self-pay | Admitting: Family Medicine

## 2020-02-16 ENCOUNTER — Ambulatory Visit (INDEPENDENT_AMBULATORY_CARE_PROVIDER_SITE_OTHER): Payer: Medicare Other | Admitting: Family Medicine

## 2020-02-16 VITALS — BP 150/90 | HR 102 | Temp 98.9°F | Ht 67.0 in | Wt 194.2 lb

## 2020-02-16 DIAGNOSIS — I10 Essential (primary) hypertension: Secondary | ICD-10-CM | POA: Diagnosis not present

## 2020-02-16 DIAGNOSIS — E119 Type 2 diabetes mellitus without complications: Secondary | ICD-10-CM

## 2020-02-16 DIAGNOSIS — F418 Other specified anxiety disorders: Secondary | ICD-10-CM | POA: Diagnosis not present

## 2020-02-16 DIAGNOSIS — N138 Other obstructive and reflux uropathy: Secondary | ICD-10-CM

## 2020-02-16 DIAGNOSIS — M8949 Other hypertrophic osteoarthropathy, multiple sites: Secondary | ICD-10-CM | POA: Diagnosis not present

## 2020-02-16 DIAGNOSIS — N401 Enlarged prostate with lower urinary tract symptoms: Secondary | ICD-10-CM | POA: Diagnosis not present

## 2020-02-16 DIAGNOSIS — M1A9XX0 Chronic gout, unspecified, without tophus (tophi): Secondary | ICD-10-CM | POA: Diagnosis not present

## 2020-02-16 DIAGNOSIS — Z23 Encounter for immunization: Secondary | ICD-10-CM | POA: Diagnosis not present

## 2020-02-16 DIAGNOSIS — M159 Polyosteoarthritis, unspecified: Secondary | ICD-10-CM

## 2020-02-16 DIAGNOSIS — R6882 Decreased libido: Secondary | ICD-10-CM

## 2020-02-16 DIAGNOSIS — F4310 Post-traumatic stress disorder, unspecified: Secondary | ICD-10-CM | POA: Diagnosis not present

## 2020-02-16 DIAGNOSIS — M150 Primary generalized (osteo)arthritis: Secondary | ICD-10-CM

## 2020-02-16 MED ORDER — LOSARTAN POTASSIUM 100 MG PO TABS: 100.0000 mg | ORAL_TABLET | Freq: Every day | ORAL | 3 refills | Status: DC

## 2020-02-16 MED ORDER — ALLOPURINOL 300 MG PO TABS: 300.0000 mg | ORAL_TABLET | Freq: Every day | ORAL | 3 refills | Status: DC

## 2020-02-16 MED ORDER — FINASTERIDE 5 MG PO TABS: 5.0000 mg | ORAL_TABLET | Freq: Every day | ORAL | 3 refills | Status: DC

## 2020-02-16 NOTE — Addendum Note (Signed)
Addended by: Matilde Sprang on: 02/16/2020 10:17 AM   Modules accepted: Orders

## 2020-02-16 NOTE — Progress Notes (Signed)
Subjective:    Patient ID: [name], male    DOB: 1946-05-11, 74 y.o.   MRN: [id number]  HPI Here to follow up on issues. He feels well except for his chronic low back pain. His BP has been a little high for several months. He watches his diet for the diabetes, and he has lost 15 lbs the past few weeks. His PTSD and depression are stable. He also mentions erection problems lately and a low libido.    Review of Systems  Constitutional: Negative.   HENT: Negative.   Eyes: Negative.   Respiratory: Negative.   Cardiovascular: Negative.   Gastrointestinal: Negative.   Genitourinary: Negative.   Musculoskeletal: Positive for arthralgias and back pain.  Skin: Negative.   Neurological: Negative.   Psychiatric/Behavioral: Negative.        Objective:   Physical Exam Constitutional:      General: He is not in acute distress.    Appearance: He is well-developed. He is not diaphoretic.  HENT:     Head: Normocephalic and atraumatic.     Right Ear: External ear normal.     Left Ear: External ear normal.     Nose: Nose normal.     Mouth/Throat:     Pharynx: No oropharyngeal exudate.  Eyes:     General: No scleral icterus.       Right eye: No discharge.        Left eye: No discharge.     Conjunctiva/sclera: Conjunctivae normal.     Pupils: Pupils are equal, round, and reactive to light.  Neck:     Thyroid: No thyromegaly.     Vascular: No JVD.     Trachea: No tracheal deviation.  Cardiovascular:     Rate and Rhythm: Normal rate and regular rhythm.     Heart sounds: Normal heart sounds. No murmur heard.  No friction rub. No gallop.   Pulmonary:     Effort: Pulmonary effort is normal. No respiratory distress.     Breath sounds: Normal breath sounds. No wheezing or rales.  Chest:     Chest wall: No tenderness.  Abdominal:     General: Bowel sounds are normal. There is no distension.     Palpations: Abdomen is soft. There is no mass.     Tenderness: There is no abdominal  tenderness. There is no guarding or rebound.  Genitourinary:    Penis: Normal. No tenderness.      Testes: Normal.     Prostate: Normal.     Rectum: Normal. Guaiac result negative.  Musculoskeletal:        General: No tenderness. Normal range of motion.     Cervical back: Neck supple.  Lymphadenopathy:     Cervical: No cervical adenopathy.  Skin:    General: Skin is warm and dry.     Coloration: Skin is not pale.     Findings: No erythema or rash.  Neurological:     Mental Status: He is alert and oriented to person, place, and time.     Cranial Nerves: No cranial nerve deficit.     Motor: No abnormal muscle tone.     Coordination: Coordination normal.     Deep Tendon Reflexes: Reflexes are normal and symmetric. Reflexes normal.  Psychiatric:        Behavior: Behavior normal.        Thought Content: Thought content normal.        Judgment: Judgment normal.  Assessment & Plan:  His HTN is not well controlled so we will increase the Losartan to a full 100 mg tablet daily. Get fasting labs including an A1c to check the diabetes. Get a testosterone level for the low libido. His depression and PTSD are stable.  Alysia Penna, MD

## 2020-02-17 LAB — CBC WITH DIFFERENTIAL/PLATELET
Absolute Monocytes: 304 cells/uL (ref 200–950)
Basophils Absolute: 18 cells/uL (ref 0–200)
Basophils Relative: 0.4 %
Eosinophils Absolute: 18 cells/uL (ref 15–500)
Eosinophils Relative: 0.4 %
HCT: 43.8 % (ref 38.5–50.0)
Hemoglobin: 14.3 g/dL (ref 13.2–17.1)
Lymphs Abs: 966 cells/uL (ref 850–3900)
MCH: 28.5 pg (ref 27.0–33.0)
MCHC: 32.6 g/dL (ref 32.0–36.0)
MCV: 87.3 fL (ref 80.0–100.0)
MPV: 11.5 fL (ref 7.5–12.5)
Monocytes Relative: 6.6 %
Neutro Abs: 3294 cells/uL (ref 1500–7800)
Neutrophils Relative %: 71.6 %
Platelets: 118 10*3/uL — ABNORMAL LOW (ref 140–400)
RBC: 5.02 10*6/uL (ref 4.20–5.80)
RDW: 15.1 % — ABNORMAL HIGH (ref 11.0–15.0)
Total Lymphocyte: 21 %
WBC: 4.6 10*3/uL (ref 3.8–10.8)

## 2020-02-17 LAB — TESTOSTERONE: Testosterone: 681 ng/dL (ref 250–827)

## 2020-02-17 LAB — BASIC METABOLIC PANEL
BUN/Creatinine Ratio: 16 (calc) (ref 6–22)
BUN: 19 mg/dL (ref 7–25)
CO2: 24 mmol/L (ref 20–32)
Calcium: 9.9 mg/dL (ref 8.6–10.3)
Chloride: 105 mmol/L (ref 98–110)
Creat: 1.2 mg/dL — ABNORMAL HIGH (ref 0.70–1.18)
Glucose, Bld: 109 mg/dL — ABNORMAL HIGH (ref 65–99)
Potassium: 4.6 mmol/L (ref 3.5–5.3)
Sodium: 137 mmol/L (ref 135–146)

## 2020-02-17 LAB — HEMOGLOBIN A1C
Hgb A1c MFr Bld: 6.5 % of total Hgb — ABNORMAL HIGH (ref <5.7)
Mean Plasma Glucose: 140 (calc)
eAG (mmol/L): 7.7 (calc)

## 2020-02-17 LAB — HEPATIC FUNCTION PANEL
AG Ratio: 1.5 (calc) (ref 1.0–2.5)
ALT: 46 U/L (ref 9–46)
AST: 42 U/L — ABNORMAL HIGH (ref 10–35)
Albumin: 4.4 g/dL (ref 3.6–5.1)
Alkaline phosphatase (APISO): 50 U/L (ref 35–144)
Bilirubin, Direct: 0.2 mg/dL (ref 0.0–0.2)
Globulin: 3 g/dL (calc) (ref 1.9–3.7)
Indirect Bilirubin: 0.6 mg/dL (calc) (ref 0.2–1.2)
Total Bilirubin: 0.8 mg/dL (ref 0.2–1.2)
Total Protein: 7.4 g/dL (ref 6.1–8.1)

## 2020-02-17 LAB — PSA: PSA: 1.44 ng/mL (ref <=4.0)

## 2020-02-17 LAB — LIPID PANEL
Cholesterol: 191 mg/dL (ref <200)
HDL: 61 mg/dL (ref >=40)
LDL Cholesterol (Calc): 105 mg/dL (calc) — ABNORMAL HIGH
Non-HDL Cholesterol (Calc): 130 mg/dL (calc) — ABNORMAL HIGH (ref <130)
Total CHOL/HDL Ratio: 3.1 (calc) (ref <5.0)
Triglycerides: 131 mg/dL (ref <150)

## 2020-02-17 LAB — TSH: TSH: 1.13 mIU/L (ref 0.40–4.50)

## 2020-02-17 LAB — URIC ACID: Uric Acid, Serum: 3.4 mg/dL — ABNORMAL LOW (ref 4.0–8.0)

## 2020-02-19 ENCOUNTER — Ambulatory Visit: Payer: Medicare Other | Attending: Internal Medicine

## 2020-02-19 DIAGNOSIS — Z23 Encounter for immunization: Secondary | ICD-10-CM

## 2020-02-19 NOTE — Progress Notes (Signed)
° °  Covid-19 Vaccination Clinic  Name:  [name]    MRN: [id number] DOB: 04-03-1946  02/19/2020  [name] was observed post Covid-19 immunization for 15 minutes without incident. He was provided with Vaccine Information Sheet and instruction to access the V-Safe system.   [name] was instructed to call 911 with any severe reactions post vaccine:  Difficulty breathing   Swelling of face and throat   A fast heartbeat   A bad rash all over body   Dizziness and weakness

## 2020-03-08 ENCOUNTER — Other Ambulatory Visit: Payer: Self-pay | Admitting: Family Medicine

## 2020-03-08 NOTE — Telephone Encounter (Signed)
Last OV: 02/16/20 Last refill: 07/17/19 #60/5 refill

## 2020-03-16 ENCOUNTER — Encounter: Payer: Self-pay | Admitting: Family Medicine

## 2020-03-16 ENCOUNTER — Other Ambulatory Visit: Payer: Self-pay

## 2020-03-16 ENCOUNTER — Ambulatory Visit (INDEPENDENT_AMBULATORY_CARE_PROVIDER_SITE_OTHER): Payer: Medicare Other | Admitting: Family Medicine

## 2020-03-16 VITALS — BP 170/92 | HR 89 | Temp 97.6°F | Ht 67.0 in | Wt 193.0 lb

## 2020-03-16 DIAGNOSIS — M5440 Lumbago with sciatica, unspecified side: Secondary | ICD-10-CM

## 2020-03-16 DIAGNOSIS — F1190 Opioid use, unspecified, uncomplicated: Secondary | ICD-10-CM | POA: Diagnosis not present

## 2020-03-16 MED ORDER — HYDROCODONE-ACETAMINOPHEN 10-325 MG PO TABS
1.0000 | ORAL_TABLET | Freq: Three times a day (TID) | ORAL | 0 refills | Status: DC | PRN
Start: 2020-03-16 — End: 2020-03-16

## 2020-03-16 MED ORDER — HYDROCODONE-ACETAMINOPHEN 10-325 MG PO TABS
1.0000 | ORAL_TABLET | Freq: Three times a day (TID) | ORAL | 0 refills | Status: DC | PRN
Start: 2020-05-16 — End: 2020-06-15

## 2020-03-16 MED ORDER — HYDROCODONE-ACETAMINOPHEN 10-325 MG PO TABS
1.0000 | ORAL_TABLET | Freq: Three times a day (TID) | ORAL | 0 refills | Status: DC | PRN
Start: 2020-04-15 — End: 2020-03-16

## 2020-03-16 NOTE — Progress Notes (Signed)
   Subjective:    Patient ID: [name], male    DOB: 03-16-1946, 74 y.o.   MRN: [id number]  HPI Here for pain management, he is doing well. Indication for chronic opioid: low back pain Medication and dose: Norco 10-325 # pills per month: 90 Last UDS date: 09-07-19 Opioid Treatment Agreement signed (Y/N): 09-06-17 Opioid Treatment Agreement last reviewed with patient:  03-16-20 NCCSRS reviewed this encounter (include red flags): Yes    Review of Systems     Objective:   Physical Exam        Assessment & Plan:  Pain management, meds were refilled.  Alysia Penna, MD

## 2020-04-15 ENCOUNTER — Other Ambulatory Visit: Payer: Self-pay | Admitting: Family Medicine

## 2020-06-14 ENCOUNTER — Other Ambulatory Visit: Payer: Self-pay

## 2020-06-15 ENCOUNTER — Encounter: Payer: Self-pay | Admitting: Family Medicine

## 2020-06-15 ENCOUNTER — Ambulatory Visit (INDEPENDENT_AMBULATORY_CARE_PROVIDER_SITE_OTHER): Payer: Medicare Other | Admitting: Family Medicine

## 2020-06-15 VITALS — BP 162/84 | HR 62 | Temp 98.1°F | Resp 18 | Wt 193.6 lb

## 2020-06-15 DIAGNOSIS — N529 Male erectile dysfunction, unspecified: Secondary | ICD-10-CM

## 2020-06-15 DIAGNOSIS — M5440 Lumbago with sciatica, unspecified side: Secondary | ICD-10-CM | POA: Diagnosis not present

## 2020-06-15 DIAGNOSIS — F1190 Opioid use, unspecified, uncomplicated: Secondary | ICD-10-CM | POA: Diagnosis not present

## 2020-06-15 MED ORDER — HYDROCODONE-ACETAMINOPHEN 10-325 MG PO TABS
1.0000 | ORAL_TABLET | Freq: Three times a day (TID) | ORAL | 0 refills | Status: AC | PRN
Start: 2020-08-13 — End: 2020-09-12

## 2020-06-15 MED ORDER — HYDROCODONE-ACETAMINOPHEN 10-325 MG PO TABS: 1.0000 | ORAL_TABLET | Freq: Three times a day (TID) | ORAL | 0 refills | Status: DC | PRN

## 2020-06-15 MED ORDER — HYDROCODONE-ACETAMINOPHEN 10-325 MG PO TABS
1.0000 | ORAL_TABLET | Freq: Three times a day (TID) | ORAL | 0 refills | Status: DC | PRN
Start: 2020-06-15 — End: 2020-06-15

## 2020-06-15 NOTE — Progress Notes (Signed)
   Subjective:    Patient ID: [name], male    DOB: [birth year], 75 y.o.   MRN: [id number]  HPI Here for pain management, he is doing well.  Indication for chronic opioid: back pain Medication and dose: Norco 10-325 # pills per month: 90 Last UDS date: 09-07-19 Opioid Treatment Agreement signed (Y/N): 09-06-17 Opioid Treatment Agreement last reviewed with patient:  06-15-20 NCCSRS reviewed this encounter (include red flags): Yes    Review of Systems     Objective:   Physical Exam        Assessment & Plan:  Pain management

## 2020-07-11 ENCOUNTER — Other Ambulatory Visit: Payer: Self-pay | Admitting: Family Medicine

## 2020-07-14 DIAGNOSIS — N401 Enlarged prostate with lower urinary tract symptoms: Secondary | ICD-10-CM | POA: Diagnosis not present

## 2020-07-14 DIAGNOSIS — R350 Frequency of micturition: Secondary | ICD-10-CM | POA: Diagnosis not present

## 2020-07-14 DIAGNOSIS — N5201 Erectile dysfunction due to arterial insufficiency: Secondary | ICD-10-CM | POA: Diagnosis not present

## 2020-08-08 DIAGNOSIS — N5201 Erectile dysfunction due to arterial insufficiency: Secondary | ICD-10-CM | POA: Diagnosis not present

## 2020-09-08 ENCOUNTER — Other Ambulatory Visit: Payer: Self-pay | Admitting: Family Medicine

## 2020-09-08 NOTE — Telephone Encounter (Signed)
Last office visit- 06/15/20 Last refill- 03/08/2020--60 tabs with 5 refills  No future appointment scheduled

## 2020-09-13 ENCOUNTER — Other Ambulatory Visit: Payer: Self-pay

## 2020-09-14 ENCOUNTER — Ambulatory Visit (INDEPENDENT_AMBULATORY_CARE_PROVIDER_SITE_OTHER): Payer: Medicare Other | Admitting: Family Medicine

## 2020-09-14 ENCOUNTER — Encounter: Payer: Self-pay | Admitting: Family Medicine

## 2020-09-14 VITALS — BP 138/84 | HR 92 | Temp 98.9°F | Wt 187.0 lb

## 2020-09-14 DIAGNOSIS — M5440 Lumbago with sciatica, unspecified side: Secondary | ICD-10-CM

## 2020-09-14 DIAGNOSIS — F1190 Opioid use, unspecified, uncomplicated: Secondary | ICD-10-CM

## 2020-09-14 MED ORDER — HYDROCODONE-ACETAMINOPHEN 10-325 MG PO TABS: 1.0000 | ORAL_TABLET | Freq: Three times a day (TID) | ORAL | 0 refills | Status: AC | PRN

## 2020-09-14 MED ORDER — HYDROCODONE-ACETAMINOPHEN 10-325 MG PO TABS: 1.0000 | ORAL_TABLET | Freq: Three times a day (TID) | ORAL | 0 refills | Status: DC | PRN

## 2020-09-14 NOTE — Progress Notes (Signed)
   Subjective:    Patient ID: [name], male    DOB: Jan 26, 1946, 75 y.o.   MRN: [id number]  HPI Here for pain management. He is doing well.    Review of Systems     Objective:   Physical Exam        Assessment & Plan:  Pain management. Indication for chronic opioid: low back pain Medication and dose: Norco 10-325 # pills per month: 90 Last UDS date: 09-14-20 Opioid Treatment Agreement signed (Y/N): 09-06-17 Opioid Treatment Agreement last reviewed with patient:  09-14-20 NCCSRS reviewed this encounter (include red flags): Yes Meds were refilled.  Alysia Penna, MD

## 2020-09-17 LAB — DRUG MONITOR, PANEL 1, W/CONF, URINE
Alphahydroxyalprazolam: NEGATIVE ng/mL (ref <25)
Alphahydroxymidazolam: NEGATIVE ng/mL (ref <50)
Alphahydroxytriazolam: NEGATIVE ng/mL (ref <50)
Aminoclonazepam: NEGATIVE ng/mL (ref <25)
Amphetamines: NEGATIVE ng/mL (ref <500)
Barbiturates: NEGATIVE ng/mL (ref <300)
Benzodiazepines: POSITIVE ng/mL — AB (ref <100)
Cocaine Metabolite: NEGATIVE ng/mL (ref <150)
Codeine: NEGATIVE ng/mL (ref <50)
Creatinine: 215.9 mg/dL
Hydrocodone: 3015 ng/mL — ABNORMAL HIGH (ref <50)
Hydromorphone: 498 ng/mL — ABNORMAL HIGH (ref <50)
Hydroxyethylflurazepam: NEGATIVE ng/mL (ref <50)
Lorazepam: 592 ng/mL — ABNORMAL HIGH (ref <50)
Marijuana Metabolite: NEGATIVE ng/mL (ref <20)
Methadone Metabolite: NEGATIVE ng/mL (ref <100)
Morphine: NEGATIVE ng/mL (ref <50)
Nordiazepam: NEGATIVE ng/mL (ref <50)
Norhydrocodone: 4011 ng/mL — ABNORMAL HIGH (ref <50)
Opiates: POSITIVE ng/mL — AB (ref <100)
Oxazepam: NEGATIVE ng/mL (ref <50)
Oxidant: NEGATIVE ug/mL
Oxycodone: NEGATIVE ng/mL (ref <100)
Phencyclidine: NEGATIVE ng/mL (ref <25)
Temazepam: NEGATIVE ng/mL (ref <50)
pH: 5.6 (ref 4.5–9.0)

## 2020-09-17 LAB — DM TEMPLATE

## 2020-09-23 ENCOUNTER — Other Ambulatory Visit (HOSPITAL_BASED_OUTPATIENT_CLINIC_OR_DEPARTMENT_OTHER): Payer: Self-pay

## 2020-09-23 ENCOUNTER — Ambulatory Visit: Payer: Medicare Other | Attending: Internal Medicine

## 2020-09-23 DIAGNOSIS — Z23 Encounter for immunization: Secondary | ICD-10-CM

## 2020-09-23 MED ORDER — PFIZER-BIONT COVID-19 VAC-TRIS 30 MCG/0.3ML IM SUSP
INTRAMUSCULAR | 0 refills | Status: AC
  Filled 2020-09-23: qty 0.3, 1d supply, fill #0

## 2020-09-23 NOTE — Progress Notes (Signed)
   Covid-19 Vaccination Clinic  Name:  IMRAN NUON    MRN: [id number] DOB: Apr 29, 1946  09/23/2020  Mr. Wence was observed post Covid-19 immunization for 15 minutes without incident. He was provided with Vaccine Information Sheet and instruction to access the V-Safe system.   Mr. Viviano was instructed to call 911 with any severe reactions post vaccine: Marland Kitchen Difficulty breathing  . Swelling of face and throat  . A fast heartbeat  . A bad rash all over body  . Dizziness and weakness   Immunizations Administered    Name Date Dose VIS Date Route   PFIZER Comrnaty(Gray TOP) Covid-19 Vaccine 09/23/2020 10:57 AM 0.3 mL 04/21/2020 Intramuscular   Manufacturer: [employer]   Lot: [id number]   NDC: [phone]

## 2020-10-12 ENCOUNTER — Other Ambulatory Visit: Payer: Self-pay | Admitting: Family Medicine

## 2020-12-15 ENCOUNTER — Other Ambulatory Visit: Payer: Self-pay

## 2020-12-16 ENCOUNTER — Ambulatory Visit (INDEPENDENT_AMBULATORY_CARE_PROVIDER_SITE_OTHER): Payer: Medicare Other | Admitting: Family Medicine

## 2020-12-16 ENCOUNTER — Encounter: Payer: Self-pay | Admitting: Family Medicine

## 2020-12-16 ENCOUNTER — Telehealth: Payer: Self-pay | Admitting: Family Medicine

## 2020-12-16 VITALS — BP 140/92 | HR 74 | Temp 98.5°F | Wt 192.0 lb

## 2020-12-16 DIAGNOSIS — R109 Unspecified abdominal pain: Secondary | ICD-10-CM

## 2020-12-16 DIAGNOSIS — M5440 Lumbago with sciatica, unspecified side: Secondary | ICD-10-CM

## 2020-12-16 DIAGNOSIS — R1011 Right upper quadrant pain: Secondary | ICD-10-CM

## 2020-12-16 DIAGNOSIS — F1190 Opioid use, unspecified, uncomplicated: Secondary | ICD-10-CM | POA: Diagnosis not present

## 2020-12-16 LAB — BASIC METABOLIC PANEL
BUN: 16 mg/dL (ref 6–23)
CO2: 25 mEq/L (ref 19–32)
Calcium: 9.3 mg/dL (ref 8.4–10.5)
Chloride: 103 mEq/L (ref 96–112)
Creatinine, Ser: 1.35 mg/dL (ref 0.40–1.50)
GFR: 51.82 mL/min — ABNORMAL LOW (ref >60.00)
Glucose, Bld: 104 mg/dL — ABNORMAL HIGH (ref 70–99)
Potassium: 4.2 mEq/L (ref 3.5–5.1)
Sodium: 136 mEq/L (ref 135–145)

## 2020-12-16 LAB — URINALYSIS
Bilirubin Urine: NEGATIVE
Hgb urine dipstick: NEGATIVE
Leukocytes,Ua: NEGATIVE
Nitrite: NEGATIVE
Specific Gravity, Urine: 1.015 (ref 1.000–1.030)
Urine Glucose: NEGATIVE
Urobilinogen, UA: 1 (ref 0.0–1.0)
pH: 7 (ref 5.0–8.0)

## 2020-12-16 LAB — CBC WITH DIFFERENTIAL/PLATELET
Basophils Absolute: 0 10*3/uL (ref 0.0–0.1)
Basophils Relative: 0.6 % (ref 0.0–3.0)
Eosinophils Absolute: 0.1 10*3/uL (ref 0.0–0.7)
Eosinophils Relative: 2.1 % (ref 0.0–5.0)
HCT: 40.5 % (ref 39.0–52.0)
Hemoglobin: 13.1 g/dL (ref 13.0–17.0)
Lymphocytes Relative: 23.5 % (ref 12.0–46.0)
Lymphs Abs: 1.1 10*3/uL (ref 0.7–4.0)
MCHC: 32.3 g/dL (ref 30.0–36.0)
MCV: 87.7 fl (ref 78.0–100.0)
Monocytes Absolute: 0.3 10*3/uL (ref 0.1–1.0)
Monocytes Relative: 6.9 % (ref 3.0–12.0)
Neutro Abs: 3.2 10*3/uL (ref 1.4–7.7)
Neutrophils Relative %: 66.9 % (ref 43.0–77.0)
Platelets: 103 10*3/uL — ABNORMAL LOW (ref 150.0–400.0)
RBC: 4.62 Mil/uL (ref 4.22–5.81)
RDW: 16.5 % — ABNORMAL HIGH (ref 11.5–15.5)
WBC: 4.7 10*3/uL (ref 4.0–10.5)

## 2020-12-16 MED ORDER — HYDROCODONE-ACETAMINOPHEN 10-325 MG PO TABS: 1.0000 | ORAL_TABLET | Freq: Three times a day (TID) | ORAL | 0 refills | Status: DC | PRN

## 2020-12-16 MED ORDER — CYCLOBENZAPRINE HCL 10 MG PO TABS: 10.0000 mg | ORAL_TABLET | Freq: Three times a day (TID) | ORAL | 5 refills | Status: AC | PRN

## 2020-12-16 NOTE — Telephone Encounter (Signed)
disregard

## 2020-12-16 NOTE — Progress Notes (Signed)
   Subjective:    Patient ID: [name], male    DOB: [birth year], 75 y.o.   MRN: [id number]  HPI Here for pain management. His low back pain has been fairly stable, but he mentions a different back pain that he felt one day 3 months ago. This came on suddenly. It was sharp and quite severe, located in the right side of his middle back. It caused him to be nauseated but he did not vomit. No change in urine or bowels, and no fever. It lasted about one hour and then went away. He has not felt it since. No hx of kidney stones.    Review of Systems  Constitutional: Negative.   Respiratory: Negative.    Cardiovascular: Negative.   Gastrointestinal: Negative.   Genitourinary: Negative.   Musculoskeletal:  Positive for back pain.      Objective:   Physical Exam Constitutional:      Appearance: Normal appearance.  Cardiovascular:     Rate and Rhythm: Normal rate and regular rhythm.  Pulmonary:     Effort: Pulmonary effort is normal.     Breath sounds: Normal breath sounds.  Abdominal:     General: Abdomen is flat. Bowel sounds are normal. There is no distension.     Palpations: Abdomen is soft. There is no mass.     Tenderness: There is no abdominal tenderness. There is no right CVA tenderness, left CVA tenderness, guarding or rebound.     Hernia: No hernia is present.  Neurological:     Mental Status: He is alert.          Assessment & Plan:  Pain management. His low back pain is stable.  Indication for chronic opioid: low back pain Medication and dose: Norco 10-325 # pills per month: 90 Last UDS date: 09-14-20 Opioid Treatment Agreement signed (Y/N): 09-06-17 Opioid Treatment Agreement last reviewed with patient:  12-16-20 NCCSRS reviewed this encounter (include red flags): Yes We will add Flexeril 10 mg to use as needed.  The middle back pain was possibly due to a kidney stone, so will get labs today and set up a renal stone CT soon. Alysia Penna, MD

## 2020-12-19 NOTE — Addendum Note (Signed)
Addended by: Alysia Penna A on: 12/19/2020 01:12 PM   Modules accepted: Orders

## 2021-01-03 ENCOUNTER — Other Ambulatory Visit: Payer: Self-pay | Admitting: Family Medicine

## 2021-01-03 DIAGNOSIS — M1A9XX0 Chronic gout, unspecified, without tophus (tophi): Secondary | ICD-10-CM

## 2021-01-03 DIAGNOSIS — N401 Enlarged prostate with lower urinary tract symptoms: Secondary | ICD-10-CM

## 2021-01-10 ENCOUNTER — Ambulatory Visit
Admission: RE | Admit: 2021-01-10 | Discharge: 2021-01-10 | Disposition: A | Discharge status: Home / Self Care | Payer: Medicare Other | Source: Ambulatory Visit | Attending: Family Medicine | Admitting: Family Medicine

## 2021-01-10 DIAGNOSIS — Z981 Arthrodesis status: Secondary | ICD-10-CM | POA: Diagnosis not present

## 2021-01-10 DIAGNOSIS — R1011 Right upper quadrant pain: Secondary | ICD-10-CM

## 2021-01-10 DIAGNOSIS — R109 Unspecified abdominal pain: Secondary | ICD-10-CM

## 2021-01-10 DIAGNOSIS — M5136 Other intervertebral disc degeneration, lumbar region: Secondary | ICD-10-CM | POA: Diagnosis not present

## 2021-01-10 DIAGNOSIS — I700 Atherosclerosis of aorta: Secondary | ICD-10-CM | POA: Diagnosis not present

## 2021-01-10 DIAGNOSIS — K5730 Diverticulosis of large intestine without perforation or abscess without bleeding: Secondary | ICD-10-CM | POA: Diagnosis not present

## 2021-01-16 ENCOUNTER — Other Ambulatory Visit: Payer: Self-pay | Admitting: Family Medicine

## 2021-01-18 DIAGNOSIS — N401 Enlarged prostate with lower urinary tract symptoms: Secondary | ICD-10-CM | POA: Diagnosis not present

## 2021-01-18 DIAGNOSIS — R350 Frequency of micturition: Secondary | ICD-10-CM | POA: Diagnosis not present

## 2021-01-18 DIAGNOSIS — N5201 Erectile dysfunction due to arterial insufficiency: Secondary | ICD-10-CM | POA: Diagnosis not present

## 2021-01-21 ENCOUNTER — Encounter: Payer: Self-pay | Admitting: Family Medicine

## 2021-01-23 NOTE — Telephone Encounter (Signed)
Yes I think he would definitely be a good candidate for this. He would need to see a pain management specialist to look into this. I can do a referral if he wishes

## 2021-02-15 ENCOUNTER — Other Ambulatory Visit: Payer: Self-pay | Admitting: Family Medicine

## 2021-03-14 ENCOUNTER — Telehealth: Payer: Self-pay | Admitting: Family Medicine

## 2021-03-14 MED ORDER — HYDROCODONE-ACETAMINOPHEN 10-325 MG PO TABS: 1.0000 | ORAL_TABLET | Freq: Three times a day (TID) | ORAL | 0 refills | Status: DC | PRN

## 2021-03-14 NOTE — Telephone Encounter (Signed)
Called patient to inform that requested medication currently has refill [facility] [address].

## 2021-03-14 NOTE — Telephone Encounter (Signed)
Spoke with patient to inform prescription has been sent to pharmacy.

## 2021-03-14 NOTE — Addendum Note (Signed)
Addended by: Apolinar Junes on: 03/14/2021 12:55 PM   Modules accepted: Orders

## 2021-03-14 NOTE — Telephone Encounter (Signed)
Pt is calling back and does not need HCTZ he would like a refill on  HYDROcodone-acetaminophen (NORCO) 10-325 MG tablet . [facility] [address]

## 2021-03-14 NOTE — Telephone Encounter (Signed)
Patient called to schedule a pain management visit with Dr. Sarajane Jews. Patient is scheduled for 11/08. Patient stated that he is going to run out of medication before his appointment on the 8th.   Patient is needing a refill of hydrochlorothiazide (HYDRODIURIL) 25 MG tablet to be [id number] [place], [place] in order to hold him over until his appointment. Informed patient that a message will be sent back but it is possible that it may have to wait on Dr. Sarajane Jews to approve it for it to be refilled.  Patient would like a call at [phone].  Please advise.

## 2021-03-14 NOTE — Telephone Encounter (Signed)
Dr. Barbie Banner patient. Requesting refill on Hydrocodone 10-325 mg to [facility] [address].    Called pharmacy earliest patient can pick up prescription is 03/16/21.      Appointment schedule to see Dr. Sarajane Jews on 03/21/21.    Please advise.

## 2021-03-21 ENCOUNTER — Ambulatory Visit: Payer: Medicare Other | Admitting: Family Medicine

## 2021-03-28 ENCOUNTER — Encounter: Payer: Self-pay | Admitting: Family Medicine

## 2021-03-28 ENCOUNTER — Ambulatory Visit (INDEPENDENT_AMBULATORY_CARE_PROVIDER_SITE_OTHER): Payer: Medicare Other | Admitting: Family Medicine

## 2021-03-28 VITALS — BP 130/84 | HR 80 | Temp 98.4°F | Wt 188.1 lb

## 2021-03-28 DIAGNOSIS — F1190 Opioid use, unspecified, uncomplicated: Secondary | ICD-10-CM | POA: Diagnosis not present

## 2021-03-28 DIAGNOSIS — E119 Type 2 diabetes mellitus without complications: Secondary | ICD-10-CM | POA: Diagnosis not present

## 2021-03-28 DIAGNOSIS — Z23 Encounter for immunization: Secondary | ICD-10-CM

## 2021-03-28 DIAGNOSIS — G8929 Other chronic pain: Secondary | ICD-10-CM

## 2021-03-28 DIAGNOSIS — M5440 Lumbago with sciatica, unspecified side: Secondary | ICD-10-CM

## 2021-03-28 DIAGNOSIS — M25561 Pain in right knee: Secondary | ICD-10-CM

## 2021-03-28 LAB — POCT GLYCOSYLATED HEMOGLOBIN (HGB A1C): Hemoglobin A1C: 6.3 % — AB (ref 4.0–5.6)

## 2021-03-28 MED ORDER — HYDROCODONE-ACETAMINOPHEN 10-325 MG PO TABS: 1.0000 | ORAL_TABLET | Freq: Three times a day (TID) | ORAL | 0 refills | Status: DC | PRN

## 2021-03-28 NOTE — Progress Notes (Signed)
   Subjective:    Patient ID: [name], male    DOB: 05-Jul-1945, 75 y.o.   MRN: [id number]  HPI Here for pain management, he is doing well.    Review of Systems     Objective:   Physical Exam        Assessment & Plan:  Pain management. Indication for chronic opioid: low back pain Medication and dose: Norco 10-325 # pills per month: 90 Last UDS date: 09-14-20 Opioid Treatment Agreement signed (Y/N): 09-06-17  Opioid Treatment Agreement last reviewed with patient:  03-28-21 NCCSRS reviewed this encounter (include red flags): Yes Meds were refilled. Alysia Penna, MD

## 2021-03-28 NOTE — Addendum Note (Signed)
Addended by: Wyvonne Lenz on: 03/28/2021 11:08 AM   Modules accepted: Orders

## 2021-03-28 NOTE — Telephone Encounter (Signed)
I did the referral to Orthopedics

## 2021-04-11 ENCOUNTER — Ambulatory Visit (INDEPENDENT_AMBULATORY_CARE_PROVIDER_SITE_OTHER): Payer: Medicare Other | Admitting: Orthopaedic Surgery

## 2021-04-11 ENCOUNTER — Encounter: Payer: Self-pay | Admitting: Orthopaedic Surgery

## 2021-04-11 ENCOUNTER — Ambulatory Visit (INDEPENDENT_AMBULATORY_CARE_PROVIDER_SITE_OTHER): Payer: Medicare Other

## 2021-04-11 ENCOUNTER — Other Ambulatory Visit: Payer: Self-pay

## 2021-04-11 VITALS — BP 176/93 | Ht 67.0 in | Wt 188.0 lb

## 2021-04-11 DIAGNOSIS — G8929 Other chronic pain: Secondary | ICD-10-CM

## 2021-04-11 DIAGNOSIS — M25561 Pain in right knee: Secondary | ICD-10-CM

## 2021-04-11 DIAGNOSIS — Z981 Arthrodesis status: Secondary | ICD-10-CM

## 2021-04-11 NOTE — Progress Notes (Signed)
Office Visit Note   Patient: [name]           Date of Birth: [birth year]           MRN: [id number] Visit Date: 04/11/2021              Requested by: Laurey Morale, MD [place],  [place] [address] PCP: Laurey Morale, MD   Assessment & Plan: Visit Diagnoses:  1. Chronic pain of right knee   2. History of fusion of lumbar spine     Plan : Patient has some chondromalacia and arthritis medial compartment right knee.  X-rays certainly do not look severe enough and his symptoms are not severe enough to consider total knee arthroplasty at this point.  He will continue walking program.  He is on stable pain management and we discussed avoiding increasing the dose.  If he develops claudication or radicular symptoms then repeat MRI scan could be obtained.  Today we reviewed his previous MRI results as well as the CT images and results that was done in August.  Pathophysiology discussed.  Follow-Up Instructions: No follow-ups on file.   Orders:  Orders Placed This Encounter  Procedures   XR KNEE 3 VIEW RIGHT   No orders of the defined types were placed in this encounter.     Procedures: No procedures performed   Clinical Data: No additional findings.   Subjective: Chief Complaint  Patient presents with   Right Knee - Pain    HPI 75 year old male with previous lumbar noninstrumented fusion for spondylolisthesis done late 70s by Dr. Arvella Nigh at the [facility] in [place] has been on chronic pain medication since his surgery.  Has had knee arthroscopy by Dr. Lorre Nick for torn meniscus and still has some knee problems.  He occasionally has some catching but no true locking of his knees.  He is on chronic pain management currently 90 hydrocodone tablets a month which is the 10/325 strength unchanged for over a year.  Patient states he also has PTSD.  Patient's wife wanted him to get a recheck about his pain.  Patient states pains been stable.  Renal CT done looking  for stone 01/11/2021 showed solid L5-S1 interbody fusion with stable grade 3 spondylolisthesis.  Previous lumbar MRI scan 10 years ago showed posterior lateral fusion L5-S1 and posterolateral fusion starting at L3 extending to S1.  Solid interbody fusion.  Review of Systems no associated bowel or bladder symptoms.  All other systems noncontributory to HPI   Objective: Vital Signs: BP (!) 176/93   Ht 5\' 7"  (1.702 m)   Wt 188 lb (85.3 kg)   BMI 29.44 kg/m   Physical Exam Constitutional:      Appearance: He is well-developed.  HENT:     Head: Normocephalic and atraumatic.     Right Ear: External ear normal.     Left Ear: External ear normal.  Eyes:     Pupils: Pupils are equal, round, and reactive to light.  Neck:     Thyroid: No thyromegaly.     Trachea: No tracheal deviation.  Cardiovascular:     Rate and Rhythm: Normal rate.  Pulmonary:     Effort: Pulmonary effort is normal.     Breath sounds: No wheezing.  Abdominal:     General: Bowel sounds are normal.     Palpations: Abdomen is soft.  Musculoskeletal:     Cervical back: Neck supple.  Skin:    General: Skin  is warm and dry.     Capillary Refill: Capillary refill takes less than 2 seconds.  Neurological:     Mental Status: He is alert and oriented to person, place, and time.  Psychiatric:        Behavior: Behavior normal.        Thought Content: Thought content normal.        Judgment: Judgment normal.    Ortho Exam healed lumbar incision he is amatory.  He has some crepitus with knee flexion extension on the right knee none on the left full extension ACL PCL exam is normal collateral ligaments are normal.  No knee effusion.  Distal pulses are intact.  Negative straight leg raising 90 degrees.  Specialty Comments:  No specialty comments available.  Imaging: No results found.   PMFS History: Patient Active Problem List   Diagnosis Date Noted   History of fusion of lumbar spine 04/11/2021   Depression with  anxiety 03/21/2018   Hepatitis, viral 01/05/2015   Gout 12/16/2014   Diabetes mellitus without complication ([place]) 09/38/1829   DIZZINESS 01/23/2010   KNEE PAIN 10/03/2009   ERECTILE DYSFUNCTION, ORGANIC 02/23/2009   BPH with urinary obstruction 01/03/2009   Lumbago 01/03/2009   POSTTRAUMATIC STRESS DISORDER 08/25/2008   Essential hypertension 08/25/2008   Osteoarthritis 08/25/2008   Past Medical History:  Diagnosis Date   Anxiety    sees Dr. Roselyn Bering and Richardo Priest   Arthritis    Blood transfusion without reported diagnosis    BPH with urinary obstruction    Chickenpox    Depression    sees Dr. Abner Greenspan and Richardo Priest   Diabetes mellitus without complication [facility])    ED (erectile dysfunction)    Hypertension    Neuromuscular disorder ([place])    occ issues with nerves in arms and hands    PTSD (post-traumatic stress disorder)    sees Dr. Roselyn Bering    Family History  Problem Relation Age of Onset   Arthritis Other    Coronary artery disease Other    Diabetes Other    Hyperlipidemia Other    Hypertension Other    Kidney disease Other    Colon cancer Neg Hx    Colon polyps Neg Hx    Esophageal cancer Neg Hx    Rectal cancer Neg Hx    Stomach cancer Neg Hx     Past Surgical History:  Procedure Laterality Date   COLONOSCOPY  05/13/2018   per Dr. Loletha Carrow, single adenomatous polyp, diverticulae, no repeats needed   KNEE SURGERY Right    [place]  2017   Social History   Occupational History   Not on file  Tobacco Use   Smoking status: Former   Smokeless tobacco: Never  Vaping Use   Vaping Use: Never used  Substance and Sexual Activity   Alcohol use: Yes    Alcohol/week: 0.0 standard drinks    Comment: occ   Drug use: No   Sexual activity: Not on file

## 2021-04-14 ENCOUNTER — Encounter: Payer: Self-pay | Admitting: Family Medicine

## 2021-04-29 ENCOUNTER — Other Ambulatory Visit: Payer: Self-pay | Admitting: Family Medicine

## 2021-05-02 ENCOUNTER — Other Ambulatory Visit: Payer: Self-pay | Admitting: Family Medicine

## 2021-06-22 ENCOUNTER — Telehealth: Payer: Self-pay | Admitting: Family Medicine

## 2021-06-22 NOTE — Telephone Encounter (Signed)
Left message for patient to call back and schedule Medicare Annual Wellness Visit (AWV) either virtually or in office. Left  my Herbie Drape number [phone]   awvi 09/11/12 per [place]  please schedule at anytime with [facility] Nurse Health Advisor 1 or 2  Verify that medicare is primary and fed bcbs is 2nd before scheduling  This should be a 45 minute visit.

## 2021-06-27 ENCOUNTER — Ambulatory Visit (INDEPENDENT_AMBULATORY_CARE_PROVIDER_SITE_OTHER): Payer: Medicare Other | Admitting: Family Medicine

## 2021-06-27 ENCOUNTER — Encounter: Payer: Self-pay | Admitting: Family Medicine

## 2021-06-27 VITALS — BP 152/98 | HR 86 | Temp 98.4°F | Wt 191.0 lb

## 2021-06-27 DIAGNOSIS — F1190 Opioid use, unspecified, uncomplicated: Secondary | ICD-10-CM | POA: Diagnosis not present

## 2021-06-27 DIAGNOSIS — M5440 Lumbago with sciatica, unspecified side: Secondary | ICD-10-CM

## 2021-06-27 MED ORDER — HYDROCODONE-ACETAMINOPHEN 10-325 MG PO TABS: 1.0000 | ORAL_TABLET | Freq: Three times a day (TID) | ORAL | 0 refills | Status: DC | PRN

## 2021-06-27 NOTE — Progress Notes (Signed)
° °  Subjective:    Patient ID: [name], male    DOB: July 04, 1945, 76 y.o.   MRN: [id number]  HPI Here for pain management. He is doing well. His back pain will flare up at times, but if he takes it easy this will settle down. 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    Review of Systems  Constitutional: Negative.   Respiratory: Negative.    Cardiovascular: Negative.   Musculoskeletal:  Positive for back pain.      Objective:   Physical Exam Constitutional:      Appearance: Normal appearance.  Cardiovascular:     Rate and Rhythm: Normal rate and regular rhythm.     Pulses: Normal pulses.     Heart sounds: Normal heart sounds.  Pulmonary:     Effort: Pulmonary effort is normal.     Breath sounds: Normal breath sounds.  Musculoskeletal:     Comments: Lower back is not tender, but ROM is somewhat limited by pain   Neurological:     Mental Status: He is alert.          Assessment & Plan:  Pain management. Indication for chronic opioid: low back pain  Medication and dose: Norco 10-325 # pills per month: 90 Last UDS date: 09-14-20 Opioid Treatment Agreement signed (Y/N): 09-06-17 Opioid Treatment Agreement last reviewed with patient:  06-27-21 NCCSRS reviewed this encounter (include red flags): Yes Meds were refilled.  Alysia Penna, MD

## 2021-06-29 ENCOUNTER — Ambulatory Visit (INDEPENDENT_AMBULATORY_CARE_PROVIDER_SITE_OTHER): Payer: Medicare Other

## 2021-06-29 VITALS — BP 122/64 | HR 86 | Temp 98.6°F | Ht 67.0 in | Wt 192.9 lb

## 2021-06-29 DIAGNOSIS — Z0000 Encounter for general adult medical examination without abnormal findings: Secondary | ICD-10-CM

## 2021-06-29 NOTE — Patient Instructions (Addendum)
[name] , Thank you for taking time to come for your Medicare Wellness Visit. I appreciate your ongoing commitment to your health goals. Please review the following plan we discussed and let me know if I can assist you in the future.   These are the goals we discussed:  Goals       Weight (lb) < 200 lb (90.7 kg) (pt-stated)      I want to control eating habits and get back to walking.        This is a list of the screening recommended for you and due dates:  Health Maintenance  Topic Date Due   Complete foot exam   Never done   Eye exam for diabetics  06/11/2019   Zoster (Shingles) Vaccine (1 of 2) 09/26/2021*   Hemoglobin A1C  09/25/2021   Tetanus Vaccine  04/04/2027   Pneumonia Vaccine  Completed   Flu Shot  Completed   COVID-19 Vaccine  Completed   Hepatitis C Screening: USPSTF Recommendation to screen - Ages 18-79 yo.  Completed   HPV Vaccine  Aged Out  *Topic was postponed. The date shown is not the original due date.    Opioid Pain Medicine Management Opioids are powerful medicines that are used to treat moderate to severe pain. When used for short periods of time, they can help you to: Sleep better. Do better in physical or occupational therapy. Feel better in the first few days after an injury. Recover from surgery. Opioids should be taken with the supervision of a trained health care provider. They should be taken for the shortest period of time possible. This is because opioids can be addictive, and the longer you take opioids, the greater your risk of addiction. This addiction can also be called opioid use disorder. What are the risks? Using opioid pain medicines for longer than 3 days increases your risk of side effects. Side effects include: Constipation. Nausea and vomiting. Breathing difficulties (respiratory depression). Drowsiness. Confusion. Opioid use disorder. Itching. Taking opioid pain medicine for a long period of time can affect your ability to  do daily tasks. It also puts you at risk for: Motor vehicle crashes. Depression. Suicide. Heart attack. Overdose, which can be life-threatening. What is a pain treatment plan? A pain treatment plan is an agreement between you and your health care provider. Pain is unique to each person, and treatments vary depending on your condition. To manage your pain, you and your health care provider need to work together. To help you do this: Discuss the goals of your treatment, including how much pain you might expect to have and how you will manage the pain. Review the risks and benefits of taking opioid medicines. Remember that a good treatment plan uses more than one approach and minimizes the chance of side effects. Be honest about the amount of medicines you take and about any drug or alcohol use. Get pain medicine prescriptions from only one health care provider. Pain can be managed with many types of alternative treatments. Ask your health care provider to refer you to one or more specialists who can help you manage pain through: Physical or occupational therapy. Counseling (cognitive behavioral therapy). Good nutrition. Biofeedback. Massage. Meditation. Non-opioid medicine. Following a gentle exercise program. How to use opioid pain medicine Taking medicine Take your pain medicine exactly as told by your health care provider. Take it only when you need it. If your pain gets less severe, you may take less than your prescribed dose if  your health care provider approves. If you are not having pain, do nottake pain medicine unless your health care provider tells you to take it. If your pain is severe, do nottry to treat it yourself by taking more pills than instructed on your prescription. Contact your health care provider for help. Write down the times when you take your pain medicine. It is easy to become confused while on pain medicine. Writing the time can help you avoid overdose. Take  other over-the-counter or prescription medicines only as told by your health care provider. Keeping yourself and others safe  While you are taking opioid pain medicine: Do not drive, use machinery, or power tools. Do not sign legal documents. Do not drink alcohol. Do not take sleeping pills. Do not supervise children by yourself. Do not do activities that require climbing or being in high places. Do not go to a lake, river, ocean, spa, or swimming pool. Do not share your pain medicine with anyone. Keep pain medicine in a locked cabinet or in a secure area where pets and children cannot reach it. Stopping your use of opioids If you have been taking opioid medicine for more than a few weeks, you may need to slowly decrease (taper) how much you take until you stop completely. Tapering your use of opioids can decrease your risk of symptoms of withdrawal, such as: Pain and cramping in the abdomen. Nausea. Sweating. Sleepiness. Restlessness. Uncontrollable shaking (tremors). Cravings for the medicine. Do not attempt to taper your use of opioids on your own. Talk with your health care provider about how to do this. Your health care provider may prescribe a step-down schedule based on how much medicine you are taking and how long you have been taking it. Getting rid of leftover pills Do not save any leftover pills. Get rid of leftover pills safely by: Taking the medicine to a prescription take-back program. This is usually offered by the county or law enforcement. Bringing them to a pharmacy that has a drug disposal container. Flushing them down the toilet. Check the label or package insert of your medicine to see whether this is safe to do. Throwing them out in the trash. Check the label or package insert of your medicine to see whether this is safe to do. If it is safe to throw it out, remove the medicine from the original container, put it into a sealable bag or container, and mix it with  used coffee grounds, food scraps, dirt, or cat litter before putting it in the trash. Follow these instructions at home: Activity Do exercises as told by your health care provider. Avoid activities that make your pain worse. Return to your normal activities as told by your health care provider. Ask your health care provider what activities are safe for you. General instructions You may need to take these actions to prevent or treat constipation: Drink enough fluid to keep your urine pale yellow. Take over-the-counter or prescription medicines. Eat foods that are high in fiber, such as beans, whole grains, and fresh fruits and vegetables. Limit foods that are high in fat and processed sugars, such as fried or sweet foods. Keep all follow-up visits. This is important. Where to find support If you have been taking opioids for a long time, you may benefit from receiving support for quitting from a local support group or counselor. Ask your health care provider for a referral to these resources in your area. Where to find more information Centers for Disease Control  and Prevention (CDC): http://www.wolf.info/ [place] Food and Drug Administration (FDA): [place] Get help right away if: You may have taken too much of an opioid (overdosed). Common symptoms of an overdose: Your breathing is slower or more shallow than normal. You have a very slow heartbeat (pulse). You have slurred speech. You have nausea and vomiting. Your pupils become very small. You have other potential symptoms: You are very confused. You faint or feel like you will faint. You have cold, clammy skin. You have blue lips or fingernails. You have thoughts of harming yourself or harming others. These symptoms may represent a serious problem that is an emergency. Do not wait to see if the symptoms will go away. Get medical help right away. Call your local emergency services (911 in the [place] Do not drive yourself to the hospital.  If  you ever feel like you may hurt yourself or others, or have thoughts about taking your own life, get help right away. Go to your nearest emergency department or: Call your local emergency services (911 in the [place] Call the [facility] 782-086-8667 in the [place] Call a suicide crisis helpline, such as the [place] at 646-007-2997 or 988 in the [place]. This is open 24 hours a day in the [place] Text the Crisis Text Line at (986)699-7933 (in the [place].). Summary Opioid medicines can help you manage moderate to severe pain for a short period of time. A pain treatment plan is an agreement between you and your health care provider. Discuss the goals of your treatment, including how much pain you might expect to have and how you will manage the pain. If you think that you or someone else may have taken too much of an opioid, get medical help right away. This information is not intended to replace advice given to you by your health care provider. Make sure you discuss any questions you have with your health care provider. Document Revised: 11/23/2020 Document Reviewed: 08/10/2020 Elsevier Patient Education  [place] directives: Yes   Conditions/risks identified: None  Next appointment: Follow up in one year for your annual wellness visit.   Preventive Care 13 Years and Older, Male Preventive care refers to lifestyle choices and visits with your health care provider that can promote health and wellness. What does preventive care include? A yearly physical exam. This is also called an annual well check. Dental exams once or twice a year. Routine eye exams. Ask your health care provider how often you should have your eyes checked. Personal lifestyle choices, including: Daily care of your teeth and gums. Regular physical activity. Eating a healthy diet. Avoiding tobacco and drug use. Limiting alcohol use. Practicing safe sex. Taking  low doses of aspirin every day. Taking vitamin and mineral supplements as recommended by your health care provider. What happens during an annual well check? The services and screenings done by your health care provider during your annual well check will depend on your age, overall health, lifestyle risk factors, and family history of disease. Counseling  Your health care provider may ask you questions about your: Alcohol use. Tobacco use. Drug use. Emotional well-being. Home and relationship well-being. Sexual activity. Eating habits. History of falls. Memory and ability to understand (cognition). Work and work Statistician. Screening  You may have the following tests or measurements: Height, weight, and BMI. Blood pressure. Lipid and cholesterol levels. These may be checked every 5 years, or more frequently if you are over 50  years old. Skin check. Lung cancer screening. You may have this screening every year starting at age 72 if you have a 30-pack-year history of smoking and currently smoke or have quit within the past 15 years. Fecal occult blood test (FOBT) of the stool. You may have this test every year starting at age 67. Flexible sigmoidoscopy or colonoscopy. You may have a sigmoidoscopy every 5 years or a colonoscopy every 10 years starting at age 82. Prostate cancer screening. Recommendations will vary depending on your family history and other risks. Hepatitis C blood test. Hepatitis B blood test. Sexually transmitted disease (STD) testing. Diabetes screening. This is done by checking your blood sugar (glucose) after you have not eaten for a while (fasting). You may have this done every 1-3 years. Abdominal aortic aneurysm (AAA) screening. You may need this if you are a current or former smoker. Osteoporosis. You may be screened starting at age 61 if you are at high risk. Talk with your health care provider about your test results, treatment options, and if necessary, the  need for more tests. Vaccines  Your health care provider may recommend certain vaccines, such as: Influenza vaccine. This is recommended every year. Tetanus, diphtheria, and acellular pertussis (Tdap, Td) vaccine. You may need a Td booster every 10 years. Zoster vaccine. You may need this after age 69. Pneumococcal 13-valent conjugate (PCV13) vaccine. One dose is recommended after age 72. Pneumococcal polysaccharide (PPSV23) vaccine. One dose is recommended after age 3. Talk to your health care provider about which screenings and vaccines you need and how often you need them. This information is not intended to replace advice given to you by your health care provider. Make sure you discuss any questions you have with your health care provider. Document Released: 05/27/2015 Document Revised: 01/18/2016 Document Reviewed: 03/01/2015 Elsevier Interactive Patient Education  2017 [place] Prevention in the Home Falls can cause injuries. They can happen to people of all ages. There are many things you can do to make your home safe and to help prevent falls. What can I do on the outside of my home? Regularly fix the edges of walkways and driveways and fix any cracks. Remove anything that might make you trip as you walk through a door, such as a raised step or threshold. Trim any bushes or trees on the path to your home. Use bright outdoor lighting. Clear any walking paths of anything that might make someone trip, such as rocks or tools. Regularly check to see if handrails are loose or broken. Make sure that both sides of any steps have handrails. Any raised decks and porches should have guardrails on the edges. Have any leaves, snow, or ice cleared regularly. Use sand or salt on walking paths during winter. Clean up any spills in your garage right away. This includes oil or grease spills. What can I do in the bathroom? Use night lights. Install grab bars by the toilet and in the tub  and shower. Do not use towel bars as grab bars. Use non-skid mats or decals in the tub or shower. If you need to sit down in the shower, use a plastic, non-slip stool. Keep the floor dry. Clean up any water that spills on the floor as soon as it happens. Remove soap buildup in the tub or shower regularly. Attach bath mats securely with double-sided non-slip rug tape. Do not have throw rugs and other things on the floor that can make you trip. What can I  do in the bedroom? Use night lights. Make sure that you have a light by your bed that is easy to reach. Do not use any sheets or blankets that are too big for your bed. They should not hang down onto the floor. Have a firm chair that has side arms. You can use this for support while you get dressed. Do not have throw rugs and other things on the floor that can make you trip. What can I do in the kitchen? Clean up any spills right away. Avoid walking on wet floors. Keep items that you use a lot in easy-to-reach places. If you need to reach something above you, use a strong step stool that has a grab bar. Keep electrical cords out of the way. Do not use floor polish or wax that makes floors slippery. If you must use wax, use non-skid floor wax. Do not have throw rugs and other things on the floor that can make you trip. What can I do with my stairs? Do not leave any items on the stairs. Make sure that there are handrails on both sides of the stairs and use them. Fix handrails that are broken or loose. Make sure that handrails are as long as the stairways. Check any carpeting to make sure that it is firmly attached to the stairs. Fix any carpet that is loose or worn. Avoid having throw rugs at the top or bottom of the stairs. If you do have throw rugs, attach them to the floor with carpet tape. Make sure that you have a light switch at the top of the stairs and the bottom of the stairs. If you do not have them, ask someone to add them for  you. What else can I do to help prevent falls? Wear shoes that: Do not have high heels. Have rubber bottoms. Are comfortable and fit you well. Are closed at the toe. Do not wear sandals. If you use a stepladder: Make sure that it is fully opened. Do not climb a closed stepladder. Make sure that both sides of the stepladder are locked into place. Ask someone to hold it for you, if possible. Clearly mark and make sure that you can see: Any grab bars or handrails. First and last steps. Where the edge of each step is. Use tools that help you move around (mobility aids) if they are needed. These include: Canes. Walkers. Scooters. Crutches. Turn on the lights when you go into a dark area. Replace any light bulbs as soon as they burn out. Set up your furniture so you have a clear path. Avoid moving your furniture around. If any of your floors are uneven, fix them. If there are any pets around you, be aware of where they are. Review your medicines with your doctor. Some medicines can make you feel dizzy. This can increase your chance of falling. Ask your doctor what other things that you can do to help prevent falls. This information is not intended to replace advice given to you by your health care provider. Make sure you discuss any questions you have with your health care provider. Document Released: 02/24/2009 Document Revised: 10/06/2015 Document Reviewed: 06/04/2014 Elsevier Interactive Patient Education  2017 [employer].

## 2021-06-29 NOTE — Progress Notes (Signed)
Subjective:   [name] is a 76 y.o. male who presents for Medicare Annual/Subsequent preventive examination.  Review of Systems        Objective:    There were no vitals filed for this visit. There is no height or weight on file to calculate BMI.  No flowsheet data found.  Current Medications (verified) Outpatient Encounter Medications as of 06/29/2021  Medication Sig   allopurinol (ZYLOPRIM) 300 MG tablet TAKE 1 TABLET BY MOUTH EVERY DAY   COVID-19 mRNA Vac-TriS, Pfizer, (PFIZER-BIONT COVID-19 VAC-TRIS) SUSP injection Inject into the muscle.   cyclobenzaprine (FLEXERIL) 10 MG tablet Take 1 tablet (10 mg total) by mouth 3 (three) times daily as needed for muscle spasms.   diclofenac (VOLTAREN) 75 MG EC tablet TAKE 1 TABLET BY MOUTH TWICE A DAY   finasteride (PROSCAR) 5 MG tablet TAKE 1 TABLET BY MOUTH EVERY DAY   hydrochlorothiazide (HYDRODIURIL) 25 MG tablet TAKE 1 TABLET BY MOUTH EVERY DAY   [START ON 08/27/2021] HYDROcodone-acetaminophen (NORCO) 10-325 MG tablet Take 1 tablet by mouth every 8 (eight) hours as needed for moderate pain.   LORazepam (ATIVAN) 1 MG tablet TAKE 1 TABLET BY MOUTH EVERY 6 HOURS AS NEEDED FOR ANXIETY   losartan (COZAAR) 100 MG tablet TAKE 1 TABLET BY MOUTH EVERY DAY   metFORMIN (GLUCOPHAGE) 1000 MG tablet TAKE 1 TABLET BY MOUTH TWICE A DAY WITH MEALS   tamsulosin (FLOMAX) 0.4 MG CAPS capsule TAKE ONE CAPSULE BY MOUTH TWICE A DAY AT 10AM AND 5PM   No facility-administered encounter medications on file as of 06/29/2021.    Allergies (verified) Abilify [aripiprazole]   History: Past Medical History:  Diagnosis Date   Anxiety    sees Dr. Roselyn Bering and Richardo Priest   Arthritis    Blood transfusion without reported diagnosis    BPH with urinary obstruction    Chickenpox    Depression    sees Dr. Abner Greenspan and Richardo Priest   Diabetes mellitus without complication [facility])    ED (erectile dysfunction)    Hypertension    Neuromuscular disorder  ([place])    occ issues with nerves in arms and hands    PTSD (post-traumatic stress disorder)    sees Dr. Roselyn Bering   Past Surgical History:  Procedure Laterality Date   COLONOSCOPY  05/13/2018   per Dr. Loletha Carrow, single adenomatous polyp, diverticulae, no repeats needed   KNEE SURGERY Right    Aneta  2017   Family History  Problem Relation Age of Onset   Arthritis Other    Coronary artery disease Other    Diabetes Other    Hyperlipidemia Other    Hypertension Other    Kidney disease Other    Colon cancer Neg Hx    Colon polyps Neg Hx    Esophageal cancer Neg Hx    Rectal cancer Neg Hx    Stomach cancer Neg Hx    Social History   Socioeconomic History   Marital status: Married    Spouse name: Not on file   Number of children: Not on file   Years of education: Not on file   Highest education level: Not on file  Occupational History   Not on file  Tobacco Use   Smoking status: Former   Smokeless tobacco: Never  Vaping Use   Vaping Use: Never used  Substance and Sexual Activity   Alcohol use: Yes    Alcohol/week: 0.0 standard drinks  Comment: occ   Drug use: No   Sexual activity: Not on file  Other Topics Concern   Not on file  Social History Narrative   Not on file   Social Determinants of Health   Financial Resource Strain: Not on file  Food Insecurity: Not on file  Transportation Needs: Not on file  Physical Activity: Not on file  Stress: Not on file  Social Connections: Not on file    Clinical Intake:    Diabetic? Yes Nutrition Risk Assessment:  Has the patient had any N/V/D within the last 2 months?  No  Does the patient have any non-healing wounds?  No  Has the patient had any unintentional weight loss or weight gain?  No   Diabetes:  Is the patient diabetic?  Yes  If diabetic, was a CBG obtained today?  No  Did the patient bring in their glucometer from home?  No  How often do you monitor your CBG's? Daily.    Financial Strains and Diabetes Management:  Are you having any financial strains with the device, your supplies or your medication? No .  Does the patient want to be seen by Chronic Care Management for management of their diabetes?  No  Would the patient like to be referred to a Nutritionist or for Diabetic Management?  No   Diabetic Exams:  Diabetic Eye Exam: Completed Yes. Overdue for diabetic eye exam. Pt has been advised about the importance in completing this exam. A referral has been placed today. Message sent to referral coordinator for scheduling purposes. Advised pt to expect a call from office referred to regarding appt.  Diabetic Foot Exam: Completed Yes. Pt has been advised about the importance in completing this exam. Pt is scheduled for diabetic foot exam on Followed by PCP.     Activities of Daily Living In your present state of health, do you have any difficulty performing the following activities: 03/28/2021  Hearing? Y  Comment wears hearing aids  Vision? N  Difficulty concentrating or making decisions? N  Walking or climbing stairs? Y  Comment Bad right knee  Dressing or bathing? N  Doing errands, shopping? N  Some recent data might be hidden    Patient Care Team: Laurey Morale, MD as PCP - General  Indicate any recent Medical Services you may have received from other than [facility] providers in the past year (date may be approximate).     Assessment:   This is a routine wellness examination for [name].  Hearing/Vision screen No results found.  Dietary issues and exercise activities discussed:     Goals Addressed   None    Depression Screen PHQ 2/9 Scores 06/27/2021 03/28/2021 06/15/2020 03/21/2018 04/03/2017 05/28/2014  PHQ - 2 Score 3 0 0 0 1 0  PHQ- 9 Score 14 0 0 - - -    Fall Risk Fall Risk  06/27/2021 03/28/2021 06/15/2020 04/08/2019 03/21/2018  Falls in the past year? 0 0 1 0 0  Comment - - - Emmi Telephone Survey: data to providers prior to load  -  Number falls in past yr: 0 0 0 - -  Injury with Fall? 0 0 0 - -  Risk for fall due to : No Fall Risks No Fall Risks - - -    FALL RISK PREVENTION PERTAINING TO THE HOME:  Any stairs in or around the home? Yes  If so, are there any without handrails? No  Home free of loose throw rugs in walkways,  pet beds, electrical cords, etc? Yes  Adequate lighting in your home to reduce risk of falls? Yes   ASSISTIVE DEVICES UTILIZED TO PREVENT FALLS:  Life alert? No  Use of a cane, walker or w/c? No  Grab bars in the bathroom? Yes  Shower chair or bench in shower? No  Elevated toilet seat or a handicapped toilet? Yes   TIMED UP AND GO:  Was the test performed? Yes .  Length of time to ambulate 10 feet: 5 sec.   Gait steady and fast without use of assistive device  Cognitive Function:        Immunizations Immunization History  Administered Date(s) Administered   Fluad Quad(high Dose 65+) 01/29/2019, 02/16/2020, 03/28/2021   Influenza Split 04/25/2011, 04/10/2012   Influenza Whole 02/23/2009   Influenza, High Dose Seasonal PF 04/11/2016, 03/15/2017, 02/02/2018   Influenza,inj,Quad PF,6+ Mos 03/03/2013, 05/03/2014, 01/05/2015   Influenza-Unspecified 03/15/2017   PFIZER Comirnaty(Gray Top)Covid-19 Tri-Sucrose Vaccine 09/23/2020   PFIZER(Purple Top)SARS-COV-2 Vaccination 06/27/2019, 07/22/2019, 02/19/2020   Pneumococcal Conjugate-13 03/21/2018   Pneumococcal Polysaccharide-23 02/16/2020   Tdap 04/03/2017   Zoster, Live 11/09/2011    TDAP status: Up to date  Flu Vaccine status: Up to date  Pneumococcal vaccine status: Up to date  Covid-19 vaccine status: Completed vaccines  Qualifies for Shingles Vaccine? Yes   Zostavax completed No   Shingrix Completed?: No.    Education has been provided regarding the importance of this vaccine. Patient has been advised to call insurance company to determine out of pocket expense if they have not yet received this vaccine. Advised may  also receive vaccine at local pharmacy or Health Dept. Verbalized acceptance and understanding.  Screening Tests Health Maintenance  Topic Date Due   FOOT EXAM  Never done   Zoster Vaccines- Shingrix (1 of 2) Never done   OPHTHALMOLOGY EXAM  06/11/2019   HEMOGLOBIN A1C  09/25/2021   TETANUS/TDAP  04/04/2027   Pneumonia Vaccine 6+ Years old  Completed   INFLUENZA VACCINE  Completed   COVID-19 Vaccine  Completed   Hepatitis C Screening  Completed   HPV VACCINES  Aged Out    Health Maintenance  Health Maintenance Due  Topic Date Due   FOOT EXAM  Never done   Zoster Vaccines- Shingrix (1 of 2) Never done   OPHTHALMOLOGY EXAM  06/11/2019    Colorectal cancer screening: No longer required.   Lung Cancer Screening: (Low Dose CT Chest recommended if Age 30-80 years, 30 pack-year currently smoking OR have quit w/in 15years.) does qualify.    Additional Screening:  Hepatitis C Screening: does qualify; Completed 02/15/21  Vision Screening: Recommended annual ophthalmology exams for early detection of glaucoma and other disorders of the eye. Is the patient up to date with their annual eye exam?  Yes  Who is the provider or what is the name of the office in which the patient attends annual eye exams? Dr Einar Gip If pt is not established with a provider, would they like to be referred to a provider to establish care? No .   Dental Screening: Recommended annual dental exams for proper oral hygiene  Community Resource Referral / Chronic Care Management:  CRR required this visit?  No   CCM required this visit?  No      Plan:     I have personally reviewed and noted the following in the patients chart:   Medical and social history Use of alcohol, tobacco or illicit drugs  Current medications and supplements including opioid  prescriptions. Patient is currently taking opioid prescriptions. Information provided to patient regarding non-opioid alternatives. Patient advised to  discuss non-opioid treatment plan with their provider. Functional ability and status Nutritional status Physical activity Advanced directives List of other physicians Hospitalizations, surgeries, and ER visits in previous 12 months Vitals Screenings to include cognitive, depression, and falls Referrals and appointments  In addition, I have reviewed and discussed with patient certain preventive protocols, quality metrics, and best practice recommendations. A written personalized care plan for preventive services as well as general preventive health recommendations were provided to patient.     Criselda Peaches, LPN   [id number]   Nurse Notes: None

## 2021-07-12 ENCOUNTER — Encounter: Payer: Self-pay | Admitting: Family Medicine

## 2021-07-12 ENCOUNTER — Ambulatory Visit (INDEPENDENT_AMBULATORY_CARE_PROVIDER_SITE_OTHER): Payer: Medicare Other | Admitting: Family Medicine

## 2021-07-12 VITALS — BP 140/90 | HR 68 | Temp 99.0°F | Ht 66.0 in | Wt 189.0 lb

## 2021-07-12 DIAGNOSIS — N401 Enlarged prostate with lower urinary tract symptoms: Secondary | ICD-10-CM

## 2021-07-12 DIAGNOSIS — I10 Essential (primary) hypertension: Secondary | ICD-10-CM | POA: Diagnosis not present

## 2021-07-12 DIAGNOSIS — E119 Type 2 diabetes mellitus without complications: Secondary | ICD-10-CM

## 2021-07-12 DIAGNOSIS — M159 Polyosteoarthritis, unspecified: Secondary | ICD-10-CM | POA: Diagnosis not present

## 2021-07-12 DIAGNOSIS — M1A9XX0 Chronic gout, unspecified, without tophus (tophi): Secondary | ICD-10-CM

## 2021-07-12 DIAGNOSIS — N138 Other obstructive and reflux uropathy: Secondary | ICD-10-CM

## 2021-07-12 DIAGNOSIS — F418 Other specified anxiety disorders: Secondary | ICD-10-CM

## 2021-07-12 LAB — CBC WITH DIFFERENTIAL/PLATELET
Basophils Absolute: 0 10*3/uL (ref 0.0–0.1)
Basophils Relative: 0.4 % (ref 0.0–3.0)
Eosinophils Absolute: 0 10*3/uL (ref 0.0–0.7)
Eosinophils Relative: 0.6 % (ref 0.0–5.0)
HCT: 42.5 % (ref 39.0–52.0)
Hemoglobin: 13.7 g/dL (ref 13.0–17.0)
Lymphocytes Relative: 18.3 % (ref 12.0–46.0)
Lymphs Abs: 1 10*3/uL (ref 0.7–4.0)
MCHC: 32.1 g/dL (ref 30.0–36.0)
MCV: 87.8 fl (ref 78.0–100.0)
Monocytes Absolute: 0.3 10*3/uL (ref 0.1–1.0)
Monocytes Relative: 5.9 % (ref 3.0–12.0)
Neutro Abs: 4 10*3/uL (ref 1.4–7.7)
Neutrophils Relative %: 74.8 % (ref 43.0–77.0)
Platelets: 119 10*3/uL — ABNORMAL LOW (ref 150.0–400.0)
RBC: 4.84 Mil/uL (ref 4.22–5.81)
RDW: 15.1 % (ref 11.5–15.5)
WBC: 5.4 10*3/uL (ref 4.0–10.5)

## 2021-07-12 LAB — BASIC METABOLIC PANEL
BUN: 23 mg/dL (ref 6–23)
CO2: 26 mEq/L (ref 19–32)
Calcium: 9.7 mg/dL (ref 8.4–10.5)
Chloride: 103 mEq/L (ref 96–112)
Creatinine, Ser: 1.41 mg/dL (ref 0.40–1.50)
GFR: 48.99 mL/min — ABNORMAL LOW (ref >60.00)
Glucose, Bld: 104 mg/dL — ABNORMAL HIGH (ref 70–99)
Potassium: 4.1 mEq/L (ref 3.5–5.1)
Sodium: 138 mEq/L (ref 135–145)

## 2021-07-12 LAB — LIPID PANEL
Cholesterol: 180 mg/dL (ref 0–200)
HDL: 65.5 mg/dL (ref >39.00)
LDL Cholesterol: 96 mg/dL (ref 0–99)
NonHDL: 114.68
Total CHOL/HDL Ratio: 3
Triglycerides: 93 mg/dL (ref 0.0–149.0)
VLDL: 18.6 mg/dL (ref 0.0–40.0)

## 2021-07-12 LAB — HEPATIC FUNCTION PANEL
ALT: 28 U/L (ref 0–53)
AST: 33 U/L (ref 0–37)
Albumin: 4.5 g/dL (ref 3.5–5.2)
Alkaline Phosphatase: 55 U/L (ref 39–117)
Bilirubin, Direct: 0.2 mg/dL (ref 0.0–0.3)
Total Bilirubin: 1 mg/dL (ref 0.2–1.2)
Total Protein: 7.7 g/dL (ref 6.0–8.3)

## 2021-07-12 LAB — PSA: PSA: 0.52 ng/mL (ref 0.10–4.00)

## 2021-07-12 LAB — URIC ACID: Uric Acid, Serum: 3.5 mg/dL — ABNORMAL LOW (ref 4.0–7.8)

## 2021-07-12 LAB — HEMOGLOBIN A1C: Hgb A1c MFr Bld: 6.5 % (ref 4.6–6.5)

## 2021-07-12 LAB — TSH: TSH: 0.86 u[IU]/mL (ref 0.35–5.50)

## 2021-07-12 MED ORDER — TAMSULOSIN HCL 0.4 MG PO CAPS: 0.8000 mg | ORAL_CAPSULE | Freq: Every day | ORAL | 3 refills | Status: AC

## 2021-07-12 MED ORDER — HYDROCHLOROTHIAZIDE 25 MG PO TABS: 25.0000 mg | ORAL_TABLET | Freq: Every day | ORAL | 3 refills | Status: AC

## 2021-07-12 NOTE — Progress Notes (Signed)
? ?Subjective:  ? ? Patient ID: [name], male    DOB: 06-07-1945, 76 y.o.   MRN: [id number] ? ?HPI ?Here to follow up on issues. He feels good in general. He still plays golf once or twice a week. His BP is stable at home. He tries to watch his diet. His OA is reasonably controlled. He gets up twice a night to urinate. His mood shave been stable.  ? ? ?Review of Systems  ?Constitutional: Negative.   ?HENT: Negative.    ?Eyes: Negative.   ?Respiratory: Negative.    ?Cardiovascular: Negative.   ?Gastrointestinal: Negative.   ?Genitourinary: Negative.   ?Musculoskeletal:  Positive for arthralgias and back pain.  ?Skin: Negative.   ?Neurological: Negative.   ?Psychiatric/Behavioral: Negative.    ? ?   ?Objective:  ? Physical Exam ?Constitutional:   ?   General: He is not in acute distress. ?   Appearance: Normal appearance. He is well-developed. He is not diaphoretic.  ?HENT:  ?   Head: Normocephalic and atraumatic.  ?   Right Ear: External ear normal.  ?   Left Ear: External ear normal.  ?   Nose: Nose normal.  ?   Mouth/Throat:  ?   Pharynx: No oropharyngeal exudate.  ?Eyes:  ?   General: No scleral icterus.    ?   Right eye: No discharge.     ?   Left eye: No discharge.  ?   Conjunctiva/sclera: Conjunctivae normal.  ?   Pupils: Pupils are equal, round, and reactive to light.  ?Neck:  ?   Thyroid: No thyromegaly.  ?   Vascular: No JVD.  ?   Trachea: No tracheal deviation.  ?Cardiovascular:  ?   Rate and Rhythm: Normal rate and regular rhythm.  ?   Heart sounds: Normal heart sounds. No murmur heard. ?  No friction rub. No gallop.  ?Pulmonary:  ?   Effort: Pulmonary effort is normal. No respiratory distress.  ?   Breath sounds: Normal breath sounds. No wheezing or rales.  ?Chest:  ?   Chest wall: No tenderness.  ?Abdominal:  ?   General: Bowel sounds are normal. There is no distension.  ?   Palpations: Abdomen is soft. There is no mass.  ?   Tenderness: There is no abdominal tenderness. There is no guarding or  rebound.  ?Genitourinary: ?   Penis: Normal. No tenderness.   ?   Testes: Normal.  ?   Prostate: Normal.  ?   Rectum: Normal. Guaiac result negative.  ?Musculoskeletal:     ?   General: No tenderness. Normal range of motion.  ?   Cervical back: Neck supple.  ?Lymphadenopathy:  ?   Cervical: No cervical adenopathy.  ?Skin: ?   General: Skin is warm and dry.  ?   Coloration: Skin is not pale.  ?   Findings: No erythema or rash.  ?Neurological:  ?   Mental Status: He is alert and oriented to person, place, and time.  ?   Cranial Nerves: No cranial nerve deficit.  ?   Motor: No abnormal muscle tone.  ?   Coordination: Coordination normal.  ?   Deep Tendon Reflexes: Reflexes are normal and symmetric. Reflexes normal.  ?Psychiatric:     ?   Behavior: Behavior normal.     ?   Thought Content: Thought content normal.     ?   Judgment: Judgment normal.  ? ? ? ? ? ?   ?  Assessment & Plan:  ?His OA and gout and HTN are stable. His depression with anxiety is stable. We will get fasting labs to check an A1c, lipids, PSA, etc. He is UTD on immunizations. We spent a total of ( 31  ) minutes reviewing records and discussing these issues.  ?Alysia Penna, MD ? ? ? ?

## 2021-07-19 DIAGNOSIS — N5201 Erectile dysfunction due to arterial insufficiency: Secondary | ICD-10-CM | POA: Diagnosis not present

## 2021-07-19 DIAGNOSIS — R350 Frequency of micturition: Secondary | ICD-10-CM | POA: Diagnosis not present

## 2021-07-19 DIAGNOSIS — N401 Enlarged prostate with lower urinary tract symptoms: Secondary | ICD-10-CM | POA: Diagnosis not present

## 2021-07-27 ENCOUNTER — Other Ambulatory Visit: Payer: Self-pay | Admitting: Family Medicine

## 2021-09-20 ENCOUNTER — Encounter: Payer: Self-pay | Admitting: Family Medicine

## 2021-09-20 ENCOUNTER — Ambulatory Visit (INDEPENDENT_AMBULATORY_CARE_PROVIDER_SITE_OTHER): Payer: Medicare Other | Admitting: Family Medicine

## 2021-09-20 VITALS — BP 140/98 | HR 79 | Temp 98.6°F | Ht 67.0 in | Wt 190.6 lb

## 2021-09-20 DIAGNOSIS — F1190 Opioid use, unspecified, uncomplicated: Secondary | ICD-10-CM | POA: Diagnosis not present

## 2021-09-20 DIAGNOSIS — M5440 Lumbago with sciatica, unspecified side: Secondary | ICD-10-CM | POA: Diagnosis not present

## 2021-09-20 MED ORDER — HYDROCODONE-ACETAMINOPHEN 10-325 MG PO TABS: 1.0000 | ORAL_TABLET | Freq: Three times a day (TID) | ORAL | 0 refills | Status: DC | PRN

## 2021-09-20 MED ORDER — HYDROCODONE-ACETAMINOPHEN 10-325 MG PO TABS
1.0000 | ORAL_TABLET | Freq: Three times a day (TID) | ORAL | 0 refills | Status: DC | PRN
Start: 2021-10-26 — End: 2021-09-20

## 2021-09-20 NOTE — Progress Notes (Signed)
? ?  Subjective:  ? ? Patient ID: [name], male    DOB: 03-Nov-1945, 76 y.o.   MRN: [id number] ? ?HPI ?Here for pain management. He is doing well.  ? ? ?Review of Systems  ?Constitutional: Negative.   ?Respiratory: Negative.    ?Cardiovascular: Negative.   ?Musculoskeletal:  Positive for back pain.  ? ?   ?Objective:  ? Physical Exam ?Constitutional:   ?   Appearance: Normal appearance.  ?Cardiovascular:  ?   Rate and Rhythm: Normal rate and regular rhythm.  ?   Pulses: Normal pulses.  ?   Heart sounds: Normal heart sounds.  ?Pulmonary:  ?   Effort: Pulmonary effort is normal.  ?   Breath sounds: Normal breath sounds.  ?Neurological:  ?   Mental Status: He is alert.  ? ? ? ? ? ?   ?Assessment & Plan:  ?Pain management. ?Indication for chronic opioid: low back pain  ?Medication and dose: Norco 10-325 ?# pills per month: 90 ?Last UDS date: 09-20-21 ?Opioid Treatment Agreement signed (Y/N): 09-06-17 ?Opioid Treatment Agreement last reviewed with patient:  09-20-21 ?NCCSRS reviewed this encounter (include red flags): Yes ?Meds were refilled.  ?Alysia Penna, MD ? ? ?

## 2021-09-25 LAB — DRUG MONITOR, PANEL 1, W/CONF, URINE
Alphahydroxyalprazolam: NEGATIVE ng/mL (ref <25)
Alphahydroxymidazolam: NEGATIVE ng/mL (ref <50)
Alphahydroxytriazolam: NEGATIVE ng/mL (ref <50)
Aminoclonazepam: NEGATIVE ng/mL (ref <25)
Amphetamines: NEGATIVE ng/mL (ref <500)
Barbiturates: NEGATIVE ng/mL (ref <300)
Benzodiazepines: POSITIVE ng/mL — AB (ref <100)
Cocaine Metabolite: NEGATIVE ng/mL (ref <150)
Codeine: NEGATIVE ng/mL (ref <50)
Creatinine: 119.5 mg/dL (ref >=20.0)
Hydrocodone: 2700 ng/mL — ABNORMAL HIGH (ref <50)
Hydromorphone: 368 ng/mL — ABNORMAL HIGH (ref <50)
Hydroxyethylflurazepam: NEGATIVE ng/mL (ref <50)
Lorazepam: 408 ng/mL — ABNORMAL HIGH (ref <50)
Marijuana Metabolite: NEGATIVE ng/mL (ref <20)
Methadone Metabolite: NEGATIVE ng/mL (ref <100)
Morphine: NEGATIVE ng/mL (ref <50)
Nordiazepam: NEGATIVE ng/mL (ref <50)
Norhydrocodone: 2634 ng/mL — ABNORMAL HIGH (ref <50)
Opiates: POSITIVE ng/mL — AB (ref <100)
Oxazepam: NEGATIVE ng/mL (ref <50)
Oxidant: NEGATIVE ug/mL (ref <200)
Oxycodone: NEGATIVE ng/mL (ref <100)
Phencyclidine: NEGATIVE ng/mL (ref <25)
Temazepam: NEGATIVE ng/mL (ref <50)
pH: 5.2 (ref 4.5–9.0)

## 2021-09-25 LAB — DM TEMPLATE

## 2021-11-27 ENCOUNTER — Emergency Department (HOSPITAL_COMMUNITY): Payer: Medicare Other

## 2021-11-27 ENCOUNTER — Emergency Department (HOSPITAL_COMMUNITY)
Admission: EM | Admit: 2021-11-27 | Discharge: 2021-11-27 | Disposition: A | Discharge status: Home / Self Care | Payer: Medicare Other | Attending: Emergency Medicine | Admitting: Emergency Medicine

## 2021-11-27 ENCOUNTER — Encounter (HOSPITAL_COMMUNITY): Payer: Self-pay

## 2021-11-27 DIAGNOSIS — R42 Dizziness and giddiness: Secondary | ICD-10-CM | POA: Insufficient documentation

## 2021-11-27 DIAGNOSIS — R519 Headache, unspecified: Secondary | ICD-10-CM | POA: Diagnosis not present

## 2021-11-27 LAB — URINALYSIS, ROUTINE W REFLEX MICROSCOPIC
Bacteria, UA: NONE SEEN
Bilirubin Urine: NEGATIVE
Glucose, UA: NEGATIVE mg/dL
Hgb urine dipstick: NEGATIVE
Ketones, ur: NEGATIVE mg/dL
Leukocytes,Ua: NEGATIVE
Nitrite: NEGATIVE
Protein, ur: 100 mg/dL — AB
Specific Gravity, Urine: 1.024 (ref 1.005–1.030)
pH: 5 (ref 5.0–8.0)

## 2021-11-27 LAB — CBC
HCT: 43.1 % (ref 39.0–52.0)
Hemoglobin: 14.2 g/dL (ref 13.0–17.0)
MCH: 29.1 pg (ref 26.0–34.0)
MCHC: 32.9 g/dL (ref 30.0–36.0)
MCV: 88.3 fL (ref 80.0–100.0)
Platelets: 133 10*3/uL — ABNORMAL LOW (ref 150–400)
RBC: 4.88 MIL/uL (ref 4.22–5.81)
RDW: 15 % (ref 11.5–15.5)
WBC: 7.1 10*3/uL (ref 4.0–10.5)
nRBC: 0 % (ref 0.0–0.2)

## 2021-11-27 LAB — BASIC METABOLIC PANEL
Anion gap: 9 (ref 5–15)
BUN: 18 mg/dL (ref 8–23)
CO2: 22 mmol/L (ref 22–32)
Calcium: 9.7 mg/dL (ref 8.9–10.3)
Chloride: 105 mmol/L (ref 98–111)
Creatinine, Ser: 1.1 mg/dL (ref 0.61–1.24)
GFR, Estimated: >60 mL/min (ref >60)
Glucose, Bld: 132 mg/dL — ABNORMAL HIGH (ref 70–99)
Potassium: 3.9 mmol/L (ref 3.5–5.1)
Sodium: 136 mmol/L (ref 135–145)

## 2021-11-27 LAB — CBG MONITORING, ED: Glucose-Capillary: 139 mg/dL — ABNORMAL HIGH (ref 70–99)

## 2021-11-27 MED ORDER — MECLIZINE HCL 25 MG PO TABS: 25.0000 mg | ORAL_TABLET | Freq: Three times a day (TID) | ORAL | 0 refills | Status: AC | PRN

## 2021-11-27 NOTE — ED Provider Triage Note (Signed)
Emergency Medicine Provider Triage Evaluation Note  [name] , a 76 y.o. male  was evaluated in triage.  Pt complains of throbbing headache for 2 days as well as loss of balance today.  Patient states that he was outside picking up sticks, and had a significant change in his balance causing him to fall backwards on his bottom.  He denies any head trauma or loss of consciousness.  States that he could not get his balance in order to stand up on his own for several minutes.  Review of Systems  Positive: As above Negative: Weakness, numbness, blurred vision, syncope, chest pain  Physical Exam  BP (!) 147/116 (BP Location: Left Arm)   Pulse 100   Temp 97.9 F (36.6 C) (Oral)   Resp 18   SpO2 98%  Gen:   Awake, no distress   Resp:  Normal effort  MSK:   Moves extremities without difficulty  Other:  No slurred speech or facial droop  Medical Decision Making  Medically screening exam initiated at 4:07 PM.  Appropriate orders placed.  Aeden R Testerman was informed that the remainder of the evaluation will be completed by another provider, this initial triage assessment does not replace that evaluation, and the importance of remaining in the ED until their evaluation is complete.     Angas Isabell T, PA-C 11/27/21 1608

## 2021-11-27 NOTE — ED Triage Notes (Signed)
Pt c/o dizziness, loss of balance, and headache that started 2 days ago. Reports since it has gotten worse.   C/o headache throbbing 5/10   Denies shob or chest pain at this time.   A/Ox4 Ambulatory in triage

## 2021-11-27 NOTE — ED Provider Notes (Signed)
[facility] DEPT Provider Note   CSN: [id number] Arrival date & time: 11/27/21  1412     History  Chief Complaint  Patient presents with   Dizziness   Headache    [name] is a 76 y.o. male.  Pt reports she has had a headache for the past 2 days.  Pt reports he began having dizziness.  Pt feels like he drifts to the sides when he is walking.  Pt denies any history of vertigo.    The history is provided by the patient. No language interpreter was used.  Dizziness Quality:  Lightheadedness Severity:  Moderate Onset quality:  Gradual Duration:  2 days Timing:  Constant Progression:  Worsening Chronicity:  New Context: not with inactivity and not with loss of consciousness   Relieved by:  Nothing Worsened by:  Nothing Ineffective treatments:  None tried Associated symptoms: headaches   Risk factors: no hx of vertigo and no new medications   Headache Pain location:  Generalized Quality:  Dull Radiates to:  Does not radiate Context: not activity   Relieved by:  Nothing Associated symptoms: dizziness        Home Medications Prior to Admission medications   Medication Sig Start Date End Date Taking? Authorizing Provider  allopurinol (ZYLOPRIM) 300 MG tablet TAKE 1 TABLET BY MOUTH EVERY DAY 01/03/21   Laurey Morale, MD  COVID-19 mRNA Vac-TriS, Pfizer, (PFIZER-BIONT COVID-19 VAC-TRIS) SUSP injection Inject into the muscle. 09/23/20   Carlyle Basques, MD  cyclobenzaprine (FLEXERIL) 10 MG tablet Take 1 tablet (10 mg total) by mouth 3 (three) times daily as needed for muscle spasms. 12/16/20   Laurey Morale, MD  diclofenac (VOLTAREN) 75 MG EC tablet TAKE 1 TABLET BY MOUTH TWICE A DAY 09/09/20   Laurey Morale, MD  finasteride (PROSCAR) 5 MG tablet TAKE 1 TABLET BY MOUTH EVERY DAY 01/03/21   Laurey Morale, MD  hydrochlorothiazide (HYDRODIURIL) 25 MG tablet Take 1 tablet (25 mg total) by mouth daily. 07/12/21   Laurey Morale, MD   HYDROcodone-acetaminophen (NORCO) 10-325 MG tablet Take 1 tablet by mouth every 8 (eight) hours as needed for moderate pain. 11/25/21 12/25/21  Laurey Morale, MD  LORazepam (ATIVAN) 1 MG tablet TAKE 1 TABLET BY MOUTH EVERY 6 HOURS AS NEEDED FOR ANXIETY 05/02/21   Laurey Morale, MD  losartan (COZAAR) 100 MG tablet TAKE 1 TABLET BY MOUTH EVERY DAY 01/18/21   Laurey Morale, MD  metFORMIN (GLUCOPHAGE) 1000 MG tablet TAKE 1 TABLET BY MOUTH TWICE A DAY WITH MEALS 07/27/21   Laurey Morale, MD  tamsulosin (FLOMAX) 0.4 MG CAPS capsule Take 2 capsules (0.8 mg total) by mouth daily. 07/12/21   Laurey Morale, MD      Allergies    Abilify [aripiprazole]    Review of Systems   Review of Systems  Neurological:  Positive for dizziness and headaches.  All other systems reviewed and are negative.   Physical Exam Updated Vital Signs BP (!) 153/100   Pulse (!) 53   Temp 98.3 F (36.8 C) (Oral)   Resp (!) 22   SpO2 99%  Physical Exam Vitals and nursing note reviewed.  Constitutional:      General: He is not in acute distress.    Appearance: He is well-developed.  HENT:     Head: Normocephalic and atraumatic.  Eyes:     Extraocular Movements: Extraocular movements intact.     Conjunctiva/sclera: Conjunctivae normal.  Pupils: Pupils are equal, round, and reactive to light.  Cardiovascular:     Rate and Rhythm: Normal rate and regular rhythm.     Heart sounds: No murmur heard. Pulmonary:     Effort: Pulmonary effort is normal. No respiratory distress.     Breath sounds: Normal breath sounds.  Abdominal:     Palpations: Abdomen is soft.     Tenderness: There is no abdominal tenderness.  Musculoskeletal:        General: No swelling.     Cervical back: Normal range of motion and neck supple.  Skin:    General: Skin is warm and dry.     Capillary Refill: Capillary refill takes less than 2 seconds.  Neurological:     Mental Status: He is alert and oriented to person, place, and time.      Cranial Nerves: No cranial nerve deficit.  Psychiatric:        Mood and Affect: Mood normal.        Behavior: Behavior normal.     ED Results / Procedures / Treatments   Labs (all labs ordered are listed, but only abnormal results are displayed) Labs Reviewed  BASIC METABOLIC PANEL - Abnormal; Notable for the following components:      Result Value   Glucose, Bld 132 (*)    All other components within normal limits  CBC - Abnormal; Notable for the following components:   Platelets 133 (*)    All other components within normal limits  URINALYSIS, ROUTINE W REFLEX MICROSCOPIC - Abnormal; Notable for the following components:   Protein, ur 100 (*)    All other components within normal limits  CBG MONITORING, ED - Abnormal; Notable for the following components:   Glucose-Capillary 139 (*)    All other components within normal limits    EKG None  Radiology MR BRAIN WO CONTRAST  Result Date: 11/27/2021 CLINICAL DATA:  Dizziness, nonspecific. EXAM: MRI HEAD WITHOUT CONTRAST TECHNIQUE: Multiplanar, multiecho pulse sequences of the brain and surrounding structures were obtained without intravenous contrast. COMPARISON:  Head CT earlier same day FINDINGS: Brain: Diffusion imaging does not show any acute or subacute infarction. No focal abnormality affects the brainstem. Few old small vessel infarctions of the peripheral left cerebellum. Cerebral hemispheres show moderate chronic small-vessel ischemic changes of the white matter. Old small vessel infarction left thalamus. No cortical or large vessel territory infarction. No mass lesion, hemorrhage, hydrocephalus or extra-axial collection. Vascular: Major vessels at the base of the brain show flow. Skull and upper cervical spine: Negative Sinuses/Orbits: Clear/normal Other: None IMPRESSION: No acute or reversible finding. Few old small vessel infarctions of the peripheral left cerebellar hemisphere. Moderate chronic small-vessel ischemic changes  of the cerebral hemispheric white matter. Old small vessel infarction left thalamus. Electronically Signed   By: Nelson Chimes M.D.   On: 11/27/2021 19:52   CT Head Wo Contrast  Result Date: 11/27/2021 CLINICAL DATA:  Headaches EXAM: CT HEAD WITHOUT CONTRAST TECHNIQUE: Contiguous axial images were obtained from the base of the skull through the vertex without intravenous contrast. RADIATION DOSE REDUCTION: This exam was performed according to the departmental dose-optimization program which includes automated exposure control, adjustment of the mA and/or kV according to patient size and/or use of iterative reconstruction technique. COMPARISON:  None Available. FINDINGS: Brain: No evidence of acute infarction, hemorrhage, hydrocephalus, extra-axial collection or mass lesion/mass effect. There is mild diffuse low-attenuation within the subcortical and periventricular white matter compatible with chronic microvascular disease. Prominence of the  sulci and ventricles compatible with brain atrophy. Vascular: No hyperdense vessel or unexpected calcification. Skull: Normal. Negative for fracture or focal lesion. Sinuses/Orbits: The paranasal sinuses and mastoid air cells are clear. Other: None IMPRESSION: 1. No acute intracranial abnormalities. 2. Chronic small vessel ischemic disease and brain atrophy. Electronically Signed   By: Kerby Moors M.D.   On: 11/27/2021 16:28    Procedures Procedures    Medications Ordered in ED Medications - No data to display  ED Course/ Medical Decision Making/ A&P                           Medical Decision Making Pt complains of dizziness and a headache for 2 days.    Amount and/or Complexity of Data Reviewed Independent Historian: spouse    Details: Pt has family member present Labs: ordered. Decision-making details documented in ED Course.    Details: labs ordered reviewed and interpreted. Radiology: ordered and independent interpretation performed. Decision-making  details documented in ED Course.    Details: Ct head  no acute  Mri shows old small vessel infarct  no acute  Risk Prescription drug management. Risk Details: I suspect symptoms are second to vertigo.  I will try pt on antivert for dizziness.             Final Clinical Impression(s) / ED Diagnoses Final diagnoses:  Vertigo    Rx / DC Orders ED Discharge Orders          Ordered    meclizine (ANTIVERT) 25 MG tablet  3 times daily PRN        11/27/21 2058              Sidney Ace 11/27/21 2059    Davonna Belling, MD 11/28/21 1453

## 2021-11-30 ENCOUNTER — Encounter: Payer: Self-pay | Admitting: Family Medicine

## 2021-11-30 ENCOUNTER — Ambulatory Visit (INDEPENDENT_AMBULATORY_CARE_PROVIDER_SITE_OTHER): Payer: Medicare Other | Admitting: Family Medicine

## 2021-11-30 VITALS — BP 142/86 | HR 98 | Temp 98.6°F | Wt 188.0 lb

## 2021-11-30 DIAGNOSIS — R42 Dizziness and giddiness: Secondary | ICD-10-CM | POA: Diagnosis not present

## 2021-11-30 DIAGNOSIS — I10 Essential (primary) hypertension: Secondary | ICD-10-CM

## 2021-11-30 DIAGNOSIS — E119 Type 2 diabetes mellitus without complications: Secondary | ICD-10-CM

## 2021-11-30 MED ORDER — ASPIRIN 81 MG PO TBEC: 81.0000 mg | DELAYED_RELEASE_TABLET | Freq: Every day | ORAL | 0 refills | Status: AC

## 2021-11-30 MED ORDER — AMLODIPINE BESYLATE 5 MG PO TABS: 5.0000 mg | ORAL_TABLET | Freq: Every day | ORAL | 2 refills | Status: DC

## 2021-11-30 MED ORDER — ATORVASTATIN CALCIUM 10 MG PO TABS: 10.0000 mg | ORAL_TABLET | Freq: Every day | ORAL | 3 refills | Status: AC

## 2021-11-30 NOTE — Progress Notes (Signed)
   Subjective:    Patient ID: [name], male    DOB: 22-Feb-1946, 76 y.o.   MRN: [id number]  HPI Here to follow up on an ED visit on 11-27-21 for dizziness and headaches. This started about 6 days ago when he was mowing his grass. This waxed and waned but never totally went away, so he went to the ED as above. No chest pain or SOB. At the ED his BP was elevated to 153/100. His exam was normal. Labs and EKG were normal. A head CT and a brain MRI were performed which showed no acute strokes, but they did demonstrate some small vessel infarctions in the cerebellum, as well as moderate small vessel ischemic changes in the cerebellum. There was also some mild atrophy. He was given Meclizine to try, and told to see [place]. He says the dizziness ans headaches are still present, but they are much milder than before. The Meclizine does not help much.    Review of Systems  Constitutional: Negative.   Respiratory: Negative.    Cardiovascular: Negative.   Neurological:  Positive for dizziness and headaches. Negative for tremors, seizures, syncope, facial asymmetry, speech difficulty, weakness, light-headedness and numbness.       Objective:   Physical Exam Constitutional:      Appearance: Normal appearance. He is not ill-appearing.  Neck:     Vascular: No carotid bruit.  Cardiovascular:     Rate and Rhythm: Normal rate and regular rhythm.     Pulses: Normal pulses.     Heart sounds: Normal heart sounds.  Pulmonary:     Effort: Pulmonary effort is normal.     Breath sounds: Normal breath sounds.  Neurological:     General: No focal deficit present.     Mental Status: He is alert and oriented to person, place, and time.     Cranial Nerves: No cranial nerve deficit.     Motor: No weakness.     Gait: Gait normal.           Assessment & Plan:  He has new onset vertigo and his HTN is not well controlled. He has signs of chronic small vessel disease in the brain. We agreed to minimize his risk  factors as much as possible, and the first thing to do is to get the BP under better control. We will add Amlodipine 5 mg daily to his Losartan and HCTZ. We will add Atorvastatin 10 mg daily. We will also add ASA 81 mg daily. He will return in 3-4 weeks to follow up.We spent a total of ( 35  ) minutes reviewing records and discussing these issues.  Alysia Penna, MD

## 2021-12-13 ENCOUNTER — Encounter: Payer: Self-pay | Admitting: Family Medicine

## 2021-12-13 DIAGNOSIS — R42 Dizziness and giddiness: Secondary | ICD-10-CM

## 2021-12-15 NOTE — Telephone Encounter (Signed)
I did the referral 

## 2021-12-25 ENCOUNTER — Ambulatory Visit (INDEPENDENT_AMBULATORY_CARE_PROVIDER_SITE_OTHER): Payer: Medicare Other | Admitting: Family Medicine

## 2021-12-25 ENCOUNTER — Encounter: Payer: Self-pay | Admitting: Family Medicine

## 2021-12-25 VITALS — BP 138/90 | HR 100 | Temp 98.7°F | Wt 187.0 lb

## 2021-12-25 DIAGNOSIS — M5440 Lumbago with sciatica, unspecified side: Secondary | ICD-10-CM | POA: Diagnosis not present

## 2021-12-25 DIAGNOSIS — F1190 Opioid use, unspecified, uncomplicated: Secondary | ICD-10-CM

## 2021-12-25 MED ORDER — LORAZEPAM 1 MG PO TABS: 1.0000 mg | ORAL_TABLET | Freq: Four times a day (QID) | ORAL | 5 refills | Status: AC | PRN

## 2021-12-25 MED ORDER — HYDROCODONE-ACETAMINOPHEN 10-325 MG PO TABS: 1.0000 | ORAL_TABLET | Freq: Three times a day (TID) | ORAL | 0 refills | Status: DC | PRN

## 2021-12-25 NOTE — Progress Notes (Signed)
   Subjective:    Patient ID: [name], male    DOB: Jan 18, 1946, 76 y.o.   MRN: [id number]  HPI Here for pain management. He is doing fairly well. His back pain flres up if her does too much yard work, but otherwise it is manageable.    Review of Systems  Constitutional: Negative.   Musculoskeletal:  Positive for back pain.       Objective:   Physical Exam Constitutional:      Appearance: Normal appearance.  Neurological:     Mental Status: He is alert.           Assessment & Plan:  Pain management. Indication for chronic opioid: low back pain Medication and dose: Norco 10-325 # pills per month: 90 Last UDS date: 09-20-21 Opioid Treatment Agreement signed (Y/N): 09-06-17 Opioid Treatment Agreement last reviewed with patient:  12-25-21 [facility] reviewed this encounter (include red flags): Yes Meds were refilled.  Alysia Penna, MD

## 2021-12-26 NOTE — Telephone Encounter (Signed)
Done

## 2022-01-03 ENCOUNTER — Ambulatory Visit (INDEPENDENT_AMBULATORY_CARE_PROVIDER_SITE_OTHER): Payer: Medicare Other | Admitting: Neurology

## 2022-01-03 ENCOUNTER — Encounter: Payer: Self-pay | Admitting: Neurology

## 2022-01-03 ENCOUNTER — Ambulatory Visit: Payer: Medicare Other | Admitting: Neurology

## 2022-01-03 VITALS — BP 140/84 | HR 105 | Ht 67.0 in | Wt 191.5 lb

## 2022-01-03 DIAGNOSIS — R2681 Unsteadiness on feet: Secondary | ICD-10-CM | POA: Diagnosis not present

## 2022-01-03 DIAGNOSIS — R42 Dizziness and giddiness: Secondary | ICD-10-CM | POA: Diagnosis not present

## 2022-01-03 NOTE — Progress Notes (Signed)
[facility]  PATIENT: [name] DOB: 01-24-1946  REQUESTING CLINICIAN: Laurey Morale, MD HISTORY FROM: Patient  REASON FOR VISIT: Dizziness     HISTORICAL  CHIEF COMPLAINT:  Chief Complaint  Patient presents with   New Patient (Initial Visit)    Rm 12. Alone. NP internal referral for Dizziness.    HISTORY OF PRESENT ILLNESS:  This is a 76 year old gentleman with vascular risks factor including hypertension, hyperlipidemia, diabetes mellitus, and previous strokes who is presenting with dizziness.  Patient reports dizziness started around July 17, he was working in his garden and after bending over he felt sudden onset of dizziness to the point that he fell.  He presented to the ED, work-up negative for any acute stroke, he was discharged on meclizine and follow-up with PCP.  Patient reports since discharge from the hospital he continues to have occasional dizziness, meclizine is not helpful, he does not feel dizziness when sitting down or laying down, dizziness usually happen upon standing up and walking and he feels like he is leaning towards 1 side.  Denies any room spinning sensation.  He never experienced anything similar in the past.  Lately he reports that his BP has been challenging to control but he is working with Dr. Sarajane Jews to address. He is on Amlodipine, HTCZ and Losartan    OTHER MEDICAL CONDITIONS: Hypertension, Hyperlipidemia, CVA, Diabetes Mellitus    REVIEW OF SYSTEMS: Full 14 system review of systems performed and negative with exception of: As noted in the HPI   ALLERGIES: Allergies  Allergen Reactions   Abilify [Aripiprazole] Other (See Comments)    lesions    HOME MEDICATIONS: Outpatient Medications Prior to Visit  Medication Sig Dispense Refill   allopurinol (ZYLOPRIM) 300 MG tablet TAKE 1 TABLET BY MOUTH EVERY DAY 90 tablet 3   amLODipine (NORVASC) 5 MG tablet Take 1 tablet (5 mg total) by mouth daily. 30 tablet 2   aspirin  EC 81 MG tablet Take 1 tablet (81 mg total) by mouth daily. Swallow whole. 30 tablet 0   atorvastatin (LIPITOR) 10 MG tablet Take 1 tablet (10 mg total) by mouth daily. 90 tablet 3   COVID-19 mRNA Vac-TriS, Pfizer, (PFIZER-BIONT COVID-19 VAC-TRIS) SUSP injection Inject into the muscle. 0.3 mL 0   cyclobenzaprine (FLEXERIL) 10 MG tablet Take 1 tablet (10 mg total) by mouth 3 (three) times daily as needed for muscle spasms. 90 tablet 5   diclofenac (VOLTAREN) 75 MG EC tablet TAKE 1 TABLET BY MOUTH TWICE A DAY 180 tablet 3   finasteride (PROSCAR) 5 MG tablet TAKE 1 TABLET BY MOUTH EVERY DAY 90 tablet 3   hydrochlorothiazide (HYDRODIURIL) 25 MG tablet Take 1 tablet (25 mg total) by mouth daily. 90 tablet 3   [START ON 02/24/2022] HYDROcodone-acetaminophen (NORCO) 10-325 MG tablet Take 1 tablet by mouth every 8 (eight) hours as needed for moderate pain. 90 tablet 0   LORazepam (ATIVAN) 1 MG tablet Take 1 tablet (1 mg total) by mouth every 6 (six) hours as needed. for anxiety 60 tablet 5   losartan (COZAAR) 100 MG tablet TAKE 1 TABLET BY MOUTH EVERY DAY 90 tablet 3   meclizine (ANTIVERT) 25 MG tablet Take 1 tablet (25 mg total) by mouth 3 (three) times daily as needed for dizziness. 30 tablet 0   metFORMIN (GLUCOPHAGE) 1000 MG tablet TAKE 1 TABLET BY MOUTH TWICE A DAY WITH MEALS 180 tablet 3   tamsulosin (FLOMAX) 0.4 MG CAPS capsule Take 2 capsules (  0.8 mg total) by mouth daily. 180 capsule 3   No facility-administered medications prior to visit.    PAST MEDICAL HISTORY: Past Medical History:  Diagnosis Date   Anxiety    sees Dr. Roselyn Bering and Richardo Priest   Arthritis    Blood transfusion without reported diagnosis    BPH with urinary obstruction    Chickenpox    Depression    sees Dr. Abner Greenspan and Richardo Priest   Diabetes mellitus without complication [facility])    ED (erectile dysfunction)    Hypertension    Neuromuscular disorder ([place])    occ issues with nerves in arms and hands    PTSD  (post-traumatic stress disorder)    sees Dr. Roselyn Bering    PAST SURGICAL HISTORY: Past Surgical History:  Procedure Laterality Date   COLONOSCOPY  05/13/2018   per Dr. Loletha Carrow, single adenomatous polyp, diverticulae, no repeats needed   KNEE SURGERY Right    LUMBAR FUSION     SHOULDER SURGERY  2017    FAMILY HISTORY: Family History  Problem Relation Age of Onset   Arthritis Other    Coronary artery disease Other    Diabetes Other    Hyperlipidemia Other    Hypertension Other    Kidney disease Other    Colon cancer Neg Hx    Colon polyps Neg Hx    Esophageal cancer Neg Hx    Rectal cancer Neg Hx    Stomach cancer Neg Hx     SOCIAL HISTORY: Social History   Socioeconomic History   Marital status: Married    Spouse name: Not on file   Number of children: Not on file   Years of education: Not on file   Highest education level: 12th grade  Occupational History   Not on file  Tobacco Use   Smoking status: Former   Smokeless tobacco: Never  Vaping Use   Vaping Use: Never used  Substance and Sexual Activity   Alcohol use: Yes    Alcohol/week: 0.0 standard drinks of alcohol    Comment: occ   Drug use: No   Sexual activity: Not on file  Other Topics Concern   Not on file  Social History Narrative   Not on file   Social Determinants of Health   Financial Resource Strain: Low Risk  (12/22/2021)   Overall Financial Resource Strain (CARDIA)    Difficulty of Paying Living Expenses: Not hard at all  Food Insecurity: No Food Insecurity (12/22/2021)   Hunger Vital Sign    Worried About Running Out of Food in the Last Year: Never true    Ran Out of Food in the Last Year: Never true  Transportation Needs: No Transportation Needs (12/22/2021)   PRAPARE - Hydrologist (Medical): No    Lack of Transportation (Non-Medical): No  Physical Activity: Unknown (12/22/2021)   Exercise Vital Sign    Days of Exercise per Week: Patient refused    Minutes  of Exercise per Session: 90 min  Stress: Stress Concern Present (12/22/2021)   [place]    Feeling of Stress : Very much  Social Connections: Unknown (12/22/2021)   Social Connection and Isolation Panel [NHANES]    Frequency of Communication with Friends and Family: Once a week    Frequency of Social Gatherings with Friends and Family: Patient refused    Attends Religious Services: Never    Marine scientist or Organizations:  No    Attends Club or Organization Meetings: 1 to 4 times per year    Marital Status: Married  Human resources officer Violence: Not At Risk (06/29/2021)   Humiliation, Afraid, Rape, and Kick questionnaire    Fear of Current or Ex-Partner: No    Emotionally Abused: No    Physically Abused: No    Sexually Abused: No    PHYSICAL EXAM  GENERAL EXAM/CONSTITUTIONAL: Vitals:  Vitals:   01/03/22 1505 01/03/22 1508 01/03/22 1544  BP: (!) 183/103 (!) 157/106 (!) 140/84  Pulse: (!) 106 (!) 118 (!) 105  Weight: 191 lb 8 oz (86.9 kg)    Height: '5\' 7"'$  (1.702 m)     Body mass index is 29.99 kg/m. Wt Readings from Last 3 Encounters:  01/03/22 191 lb 8 oz (86.9 kg)  12/25/21 187 lb (84.8 kg)  11/30/21 188 lb (85.3 kg)   Patient is in no distress; well developed, nourished and groomed; neck is supple  EYES: Pupils round and reactive to light, Visual fields full to confrontation, Extraocular movements intacts,   MUSCULOSKELETAL: Gait, strength, tone, movements noted in Neurologic exam below  NEUROLOGIC: MENTAL STATUS:      No data to display         awake, alert, oriented to person, place and time recent and remote memory intact normal attention and concentration language fluent, comprehension intact, naming intact fund of knowledge appropriate  CRANIAL NERVE:  2nd, 3rd, 4th, 6th - pupils equal and reactive to light, visual fields full to confrontation, extraocular muscles intact, no  nystagmus 5th - facial sensation symmetric 7th - facial strength symmetric 8th - hearing intact 9th - palate elevates symmetrically, uvula midline 11th - shoulder shrug symmetric 12th - tongue protrusion midline  MOTOR:  normal bulk and tone, full strength in the BUE, BLE  SENSORY:  normal and symmetric to light touch  COORDINATION:  finger-nose-finger, fine finger movements normal  GAIT/STATION:  Wide based gait, difficulty with tandem gait and negative Romberg    DIAGNOSTIC DATA (LABS, IMAGING, TESTING) - I reviewed patient records, labs, notes, testing and imaging myself where available.  Lab Results  Component Value Date   WBC 7.1 11/27/2021   HGB 14.2 11/27/2021   HCT 43.1 11/27/2021   MCV 88.3 11/27/2021   PLT 133 (L) 11/27/2021      Component Value Date/Time   NA 136 11/27/2021 1613   K 3.9 11/27/2021 1613   CL 105 11/27/2021 1613   CO2 22 11/27/2021 1613   GLUCOSE 132 (H) 11/27/2021 1613   BUN 18 11/27/2021 1613   CREATININE 1.10 11/27/2021 1613   CREATININE 1.20 (H) 02/16/2020 0922   CALCIUM 9.7 11/27/2021 1613   PROT 7.7 07/12/2021 0922   ALBUMIN 4.5 07/12/2021 0922   AST 33 07/12/2021 0922   ALT 28 07/12/2021 0922   ALKPHOS 55 07/12/2021 0922   BILITOT 1.0 07/12/2021 0922   GFRNONAA >60 11/27/2021 1613   GFRAA >90 06/29/2012 0512   Lab Results  Component Value Date   CHOL 180 07/12/2021   HDL 65.50 07/12/2021   LDLCALC 96 07/12/2021   LDLDIRECT 104.8 06/05/2011   TRIG 93.0 07/12/2021   CHOLHDL 3 07/12/2021   Lab Results  Component Value Date   HGBA1C 6.5 07/12/2021   No results found for: "VITAMINB12" Lab Results  Component Value Date   TSH 0.86 07/12/2021    MRI Brain 11/27/2021 No acute or reversible finding. Few old small vessel infarctions of the peripheral left cerebellar hemisphere. Moderate  chronic small-vessel ischemic changes of the cerebral hemispheric white matter. Old small vessel infarction left thalamus.    ASSESSMENT  AND PLAN  76 y.o. year old male with vascular risk factors including hypertension, hyperlipidemia, diabetes mellitus, previous stroke who is presenting with dizziness since June 17.  Patient reported dizziness is intermittent and he only feels it once he started walking.  He describes it as unsteadiness and sometimes leaning towards the side.  He tried meclizine without clear benefit.  His MRI brain is negative for any tumor, any acute stroke that could account for the dizziness.  The dizziness is likely peripheral.  I have advised patient to continue increased water intake, to use caution with ambulation and in the next couple weeks and if his dizziness does not improve to contact me and I will refer him to vestibular therapy.  Today his blood pressure was markedly elevated, I did advise him to continue to follow-up with Dr. Sarajane Jews for better control.  He voices understanding.   1. Dizziness   2. Unsteadiness      Patient Instructions  Continue current medications  Continue to follow up with Dr. Sarajane Jews for management of blood pressure  Increase water intake  Please contact me in a couple weeks in symptoms are not improved, will consider referral to vestibular therapy  Return as needed  No orders of the defined types were placed in this encounter.   No orders of the defined types were placed in this encounter.   Return if symptoms worsen or fail to improve.  I have spent a total of 50 minutes dedicated to this patient today, preparing to see patient, performing a medically appropriate examination and evaluation, ordering tests and/or medications and procedures, and counseling and educating the patient/family/caregiver; independently interpreting result and communicating results to the family/patient/caregiver; and documenting clinical information in the electronic medical record.   Alric Ran, MD 01/03/2022, 4:27 PM  [facility] [address] [place], [place]  [address] [phone]

## 2022-01-03 NOTE — Patient Instructions (Signed)
Continue current medications  Continue to follow up with Dr. Sarajane Jews for management of blood pressure  Increase water intake  Please contact me in a couple weeks in symptoms are not improved, will consider referral to vestibular therapy  Return as needed

## 2022-01-25 ENCOUNTER — Other Ambulatory Visit: Payer: Self-pay | Admitting: Family Medicine

## 2022-01-25 DIAGNOSIS — N401 Enlarged prostate with lower urinary tract symptoms: Secondary | ICD-10-CM

## 2022-01-25 DIAGNOSIS — M1A9XX0 Chronic gout, unspecified, without tophus (tophi): Secondary | ICD-10-CM

## 2022-02-21 ENCOUNTER — Encounter: Payer: Self-pay | Admitting: Family Medicine

## 2022-02-21 DIAGNOSIS — Z23 Encounter for immunization: Secondary | ICD-10-CM | POA: Diagnosis not present

## 2022-03-08 ENCOUNTER — Other Ambulatory Visit: Payer: Self-pay | Admitting: Family Medicine

## 2022-03-12 ENCOUNTER — Telehealth: Payer: Self-pay | Admitting: Family Medicine

## 2022-03-12 MED ORDER — HYDROCODONE-ACETAMINOPHEN 10-325 MG PO TABS: 1.0000 | ORAL_TABLET | Freq: Three times a day (TID) | ORAL | 0 refills | Status: AC | PRN

## 2022-03-12 NOTE — Telephone Encounter (Signed)
Pharmacy updated.

## 2022-03-12 NOTE — Telephone Encounter (Signed)
Done

## 2022-03-12 NOTE — Telephone Encounter (Signed)
Pt is calling and [facility] does not have in stock HYDROcodone-acetaminophen (NORCO) 10-325 MG tablet however please send to  [facility] [place], [place] AT [facility] Phone:  [phone]  Fax:  [phone]

## 2022-04-03 ENCOUNTER — Other Ambulatory Visit: Payer: Self-pay | Admitting: Family Medicine

## 2022-04-03 DIAGNOSIS — M1A9XX0 Chronic gout, unspecified, without tophus (tophi): Secondary | ICD-10-CM

## 2022-04-13 ENCOUNTER — Ambulatory Visit (INDEPENDENT_AMBULATORY_CARE_PROVIDER_SITE_OTHER): Payer: Medicare Other | Admitting: Family Medicine

## 2022-04-13 ENCOUNTER — Encounter: Payer: Self-pay | Admitting: Family Medicine

## 2022-04-13 VITALS — BP 124/78 | HR 87 | Temp 98.2°F | Wt 195.0 lb

## 2022-04-13 DIAGNOSIS — M5440 Lumbago with sciatica, unspecified side: Secondary | ICD-10-CM

## 2022-04-13 MED ORDER — HYDROCODONE-ACETAMINOPHEN 5-325 MG PO TABS
2.0000 | ORAL_TABLET | Freq: Three times a day (TID) | ORAL | 0 refills | Status: DC | PRN
Start: 2022-04-13 — End: 2022-04-13

## 2022-04-13 MED ORDER — HYDROCODONE-ACETAMINOPHEN 5-325 MG PO TABS: 2.0000 | ORAL_TABLET | Freq: Three times a day (TID) | ORAL | 0 refills | Status: DC | PRN

## 2022-04-13 MED ORDER — HYDROCODONE-ACETAMINOPHEN 5-325 MG PO TABS: 2.0000 | ORAL_TABLET | Freq: Three times a day (TID) | ORAL | 0 refills | Status: AC | PRN

## 2022-04-13 NOTE — Progress Notes (Signed)
   Subjective:    Patient ID: [name], male    DOB: 01-Apr-1946, 76 y.o.   MRN: [id number]  HPI Here for pain management. He is doing well overall. He is walking and working out with light weights again.    Review of Systems  Constitutional: Negative.   Respiratory: Negative.    Cardiovascular: Negative.   Musculoskeletal:  Positive for back pain.       Objective:   Physical Exam Constitutional:      Appearance: Normal appearance.  Neurological:     Mental Status: He is alert.           Assessment & Plan:  Pain management. Indication for chronic opioid: low back pain Medication and dose: Norco 5-325 # pills per month: 180 Last UDS date: 09-20-21 Opioid Treatment Agreement signed (Y/N): 09-06-17 Opioid Treatment Agreement last reviewed with patient:  04-13-22 [facility] reviewed this encounter (include red flags): Yes He normally takes Norco 10-325, but due to medication shortages we have changed this to Jerome taking 2 pills at a time.  Alysia Penna, MD

## 2022-04-21 ENCOUNTER — Other Ambulatory Visit: Payer: Self-pay | Admitting: Family Medicine

## 2022-05-14 ENCOUNTER — Other Ambulatory Visit: Payer: Self-pay | Admitting: Family Medicine
